# Patient Record
Sex: Male | Born: 1965
Health system: Southern US, Community
[De-identification: ages and names within clinical notes are randomized; demographics above are authoritative.]

## PROBLEM LIST (undated history)

## (undated) DIAGNOSIS — M1990 Unspecified osteoarthritis, unspecified site: Secondary | ICD-10-CM

## (undated) DIAGNOSIS — K219 Gastro-esophageal reflux disease without esophagitis: Secondary | ICD-10-CM

## (undated) DIAGNOSIS — K589 Irritable bowel syndrome without diarrhea: Secondary | ICD-10-CM

## (undated) DIAGNOSIS — I499 Cardiac arrhythmia, unspecified: Secondary | ICD-10-CM

## (undated) DIAGNOSIS — E785 Hyperlipidemia, unspecified: Secondary | ICD-10-CM

## (undated) DIAGNOSIS — F419 Anxiety disorder, unspecified: Secondary | ICD-10-CM

## (undated) DIAGNOSIS — I4891 Unspecified atrial fibrillation: Secondary | ICD-10-CM

## (undated) DIAGNOSIS — B009 Herpesviral infection, unspecified: Secondary | ICD-10-CM

## (undated) HISTORY — PX: HERNIA REPAIR: SHX51

## (undated) HISTORY — DX: Herpesviral infection, unspecified: B00.9

## (undated) HISTORY — PX: OTHER SURGICAL HISTORY: SHX169

## (undated) HISTORY — DX: Unspecified osteoarthritis, unspecified site: M19.90

## (undated) HISTORY — DX: Anxiety disorder, unspecified: F41.9

## (undated) HISTORY — PX: ENDOSCOPIC PLANTAR FASCIOTOMY: SUR443

## (undated) HISTORY — DX: Hyperlipidemia, unspecified: E78.5

## (undated) HISTORY — DX: Cardiac arrhythmia, unspecified: I49.9

## (undated) HISTORY — DX: Gastro-esophageal reflux disease without esophagitis: K21.9

## (undated) HISTORY — PX: WISDOM TOOTH EXTRACTION: SHX21

## (undated) HISTORY — DX: Irritable bowel syndrome, unspecified: K58.9

## (undated) HISTORY — PX: POLYPECTOMY: SHX149

## (undated) HISTORY — PX: UPPER GASTROINTESTINAL ENDOSCOPY: SHX188

---

## 2013-03-02 ENCOUNTER — Encounter (INDEPENDENT_AMBULATORY_CARE_PROVIDER_SITE_OTHER): Payer: Self-pay

## 2013-03-02 ENCOUNTER — Ambulatory Visit (INDEPENDENT_AMBULATORY_CARE_PROVIDER_SITE_OTHER): Payer: BC Managed Care – PPO

## 2013-03-02 VITALS — BP 112/68 | HR 68 | Resp 16 | Ht 74.0 in | Wt 190.0 lb

## 2013-03-02 DIAGNOSIS — Z09 Encounter for follow-up examination after completed treatment for conditions other than malignant neoplasm: Secondary | ICD-10-CM

## 2013-03-02 DIAGNOSIS — M722 Plantar fascial fibromatosis: Secondary | ICD-10-CM

## 2013-03-02 NOTE — Progress Notes (Signed)
  Subjective:    Patient ID: Joshua Chavez, male    DOB: 1966-04-05, 47 y.o.   MRN: [id number] "It's getting better by the day.  I had to go up a size on my shoes."  Post op EPF HPI    Review of Systems not done     Objective:   Physical Exam  Neurovascular status is intact epicritic and proprioceptive sensations intact. Patient continues to have some slight inferior heel pain left heel incision is well coapted suggested continued warm compress ice packs alternating every evening. Patient has been avoiding NSAIDs however we'll occasionally use Mobic as needed. Generally improving and has been able to return to work activities. Also, the shoe size on his left foot which seems to be helping with his walking activities.      Assessment & Plan:  Plantar fasciitis/heel spur syndrome left foot improving postoperatively approximately 2 and half months postop EPF procedure. Plan for 2-3 months for long-term followup maintain ice and NSAID therapies as needed maintain orthotics and a coming shoe gear as instructed.  Alvan Dame DPM

## 2013-03-02 NOTE — Patient Instructions (Signed)
Alternate daily applications of heat and ice 15 minutes each heat and the ice to the inferior left heel  ICE INSTRUCTIONS  Apply ice or cold pack to the affected area at least 3 times a day for 10-15 minutes each time.  You should also use ice after prolonged activity or vigorous exercise.  Do not apply ice longer than 20 minutes at one time.  Always keep a cloth between your skin and the ice pack to prevent burns.  Being consistent and following these instructions will help control your symptoms.  We suggest you purchase a gel ice pack because they are reusable and do bit leak.  Some of them are designed to wrap around the area.  Use the method that works best for you.  Here are some other suggestions for icing.   Use a frozen bag of peas or corn-inexpensive and molds well to your body, usually stays frozen for 10 to 20 minutes.  Wet a towel with cold water and squeeze out the excess until it's damp.  Place in a bag in the freezer for 20 minutes. Then remove and use.

## 2013-06-07 ENCOUNTER — Ambulatory Visit (INDEPENDENT_AMBULATORY_CARE_PROVIDER_SITE_OTHER): Payer: BC Managed Care – PPO

## 2013-06-07 VITALS — BP 118/77 | HR 68 | Resp 18

## 2013-06-07 DIAGNOSIS — M2040 Other hammer toe(s) (acquired), unspecified foot: Secondary | ICD-10-CM

## 2013-06-07 DIAGNOSIS — G5762 Lesion of plantar nerve, left lower limb: Secondary | ICD-10-CM

## 2013-06-07 DIAGNOSIS — G5760 Lesion of plantar nerve, unspecified lower limb: Secondary | ICD-10-CM

## 2013-06-07 DIAGNOSIS — M722 Plantar fascial fibromatosis: Secondary | ICD-10-CM

## 2013-06-07 DIAGNOSIS — M779 Enthesopathy, unspecified: Secondary | ICD-10-CM

## 2013-06-07 DIAGNOSIS — Z09 Encounter for follow-up examination after completed treatment for conditions other than malignant neoplasm: Secondary | ICD-10-CM

## 2013-06-07 NOTE — Progress Notes (Signed)
   Subjective:    Patient ID: Ivor MessierDavid Perin, male    DOB: 08-10-1965, 48 y.o.   MRN: [id number]  HPI I am doing ok on my left and it has been 5 months since my surgery in august of last year and it does burn and throb and it does get numb and tingle on the side of my right foot    Review of Systems no other changes on reviewed having some slight breathing problems otherwise unremarkable    Objective:   Physical Exam Okay objective findings reveal intact in palpable pedal pulses DP and PT +2/4 bilateral Refill time 3 seconds all digits epicritic and proprioceptive sensations intact and symmetric there is still some slight tenderness not in the inferior calcaneal tubercle although more and mid arch especially with strenuous and prolonged activities. The heel is generally improving significantly however patient is having what he describes as numbness and tingling and stinging sensations up into his toes involving somewhat the hallux avulsing second third toes on evaluation this time patient has semirigid digital contractures more so on left and right foot with hammertoe deformities 234 and 5 have some weakness in the extensor tendons with the long flexors overpowering the toes causing a contracture. Patient's may be having some neuroma symptomology second interspace due to digital contractures and capsulitis and mild arthropathy the MTP joints pain on direct lateral compression is consistent with possible early neuroma       Assessment & Plan:  Plan at this time patient recently resumed utilizing meloxicam and will continue to do so also use ice and we'll obtain some wider shoes. Metatarsal pads applied to the orthotics and dispensed extra-depth has for use in other orthotics that patient uses in his work boots and shoes. Reappointed with the next month if fails to improve may be candidate for either a steroid Dosepak or steroid injection second interspace. Again stressed ice to the area meloxicam and  wider shoes which are neuromas dispensed to the patient.  Alvan Dameichard Trellis Vanoverbeke DPM

## 2013-06-07 NOTE — Patient Instructions (Signed)
Morton's Neuroma Neuralgia (nerve pain) or neuroma (benign [non-cancerous] nerve tumor) may develop on any interdigital nerve. The interdigital nerves (nerves between digits) of the foot travel beneath and between the metatarsals (long bones of the fore foot) and pass the nerve endings to the toes. The third interdigital is a common place for a small neuroma to form called Morton's neuroma. Another nerve to be affected commonly is the fourth interdigital nerve. This would be in approximately in the area of the base or ball under the bottom of your fourth toe. This condition occurs more commonly in women and is usually on one side. It is usually first noticed by pain radiating (spreading) to the ball of the foot or to the toes. CAUSES The cause of interdigital neuralgia may be from low grade repetitive trauma (damage caused by an accident) as in activities causing a repeated pounding of the foot (running, jumping etc.). It is also caused by improper footwear or recent loss of the fatty padding on the bottom of the foot. TREATMENT  The condition often resolves (goes away) simply with decreasing activity if that is thought to be the cause. Proper shoes are beneficial. Orthotics (special foot support aids) such as a metatarsal bar are often beneficial. This condition usually responds to conservative therapy, however if surgery is necessary it usually brings complete relief. HOME CARE INSTRUCTIONS   Apply ice to the area of soreness for 15-20 minutes, 03-04 times per day, while awake for the first 2 days. Put ice in a plastic bag and place a towel between the bag of ice and your skin.  Only take over-the-counter or prescription medicines for pain, discomfort, or fever as directed by your caregiver. MAKE SURE YOU:   Understand these instructions.  Will watch your condition.  Will get help right away if you are not doing well or get worse. Document Released: 08/03/2000 Document Revised: 07/20/2011  Document Reviewed: 04/27/2005 [employer] Patient Information 2014 [employer].  

## 2016-04-08 HISTORY — PX: ESOPHAGOGASTRODUODENOSCOPY: SHX1529

## 2016-04-08 HISTORY — PX: COLONOSCOPY: SHX174

## 2016-09-04 ENCOUNTER — Ambulatory Visit (INDEPENDENT_AMBULATORY_CARE_PROVIDER_SITE_OTHER): Payer: BLUE CROSS/BLUE SHIELD

## 2016-09-04 ENCOUNTER — Encounter: Payer: Self-pay | Admitting: Podiatry

## 2016-09-04 ENCOUNTER — Ambulatory Visit (INDEPENDENT_AMBULATORY_CARE_PROVIDER_SITE_OTHER): Payer: BLUE CROSS/BLUE SHIELD | Admitting: Podiatry

## 2016-09-04 DIAGNOSIS — M216X9 Other acquired deformities of unspecified foot: Secondary | ICD-10-CM

## 2016-09-04 DIAGNOSIS — M2042 Other hammer toe(s) (acquired), left foot: Secondary | ICD-10-CM

## 2016-09-04 DIAGNOSIS — M722 Plantar fascial fibromatosis: Secondary | ICD-10-CM

## 2016-09-04 NOTE — Progress Notes (Signed)
   Subjective:    Patient ID: Joshua Chavez, male    DOB: 1965-05-14, 51 y.o.   MRN: [id number]  HPI 51 year old male presents the also concerns of hammertoes to his feet. He has multiple pairs of orthotics he is inquiring about possibly getting some of them refurbished. He denies any specific pain to his foot except for on the hammertoes as they rub against issues. He also gets pain we points to submetatarsal 5. This is mostly with weightbearing or pressure. Denies any recent injury or trauma. No swelling or redness.   Review of Systems  All other systems reviewed and are negative.      Objective:   Physical Exam General: AAO x3, NAD  Dermatological: Skin is warm, dry and supple bilateral. Nails x 10 are well manicured; remaining integument appears unremarkable at this time. There are no open sores, no preulcerative lesions, no rash or signs of infection present.  Vascular: Dorsalis Pedis artery and Posterior Tibial artery pedal pulses are 2/4 bilateral with immedate capillary fill time. Pedal hair growth present. No varicosities and no lower extremity edema present bilateral. There is no pain with calf compression, swelling, warmth, erythema.   Neruologic: Grossly intact via light touch bilateral. Vibratory intact via tuning fork bilateral. Protective threshold with Semmes Wienstein monofilament intact to all pedal sites bilateral.   Musculoskeletal: Cavus foot type is present laterally. Hammertoe contractures are present. There is prominent metatarsal heads plantarly with atrophy of the fat pad. Minimal tenderness at metatarsal 5 left foot there is no overlying edema, erythema, increase in warmth there is no pain vibratory sensation. MMT/5. Range of motion intact.  Assessment: Hammertoe deformity laterally due to cavus foot, submetatarsal 5 left foot pain  Plan: -Treatment options discussed including all alternatives, risks, and complications -Etiology of symptoms were  discussed -X-rays were obtained and reviewed with the patient. Cavus foot type present. No evidence of acute fracture identified. -Dispensed to crest to help take pressure off the toes. Discussed with him the future surgical intervention however he wishes about any surgery at this time. -Ice include one set of orthotics which are pointing apart. Ice and the other pair back to [employer].  -Follow-up once inserts arrive or sooner if needed. Call any questions or concerns.  Ovid Curd, DPM

## 2016-11-05 ENCOUNTER — Telehealth: Payer: Self-pay | Admitting: *Deleted

## 2016-11-05 DIAGNOSIS — M722 Plantar fascial fibromatosis: Secondary | ICD-10-CM

## 2016-11-05 NOTE — Telephone Encounter (Signed)
Patient's wife was in for a visit with Dr Ardelle AntonWagoner and was asking about her husband's inserts and I stated that I would check to see if they were here and they were so the patient paid for them. Misty StanleyLisa

## 2016-11-07 ENCOUNTER — Encounter (HOSPITAL_BASED_OUTPATIENT_CLINIC_OR_DEPARTMENT_OTHER): Payer: Self-pay | Admitting: *Deleted

## 2016-11-07 ENCOUNTER — Emergency Department (HOSPITAL_BASED_OUTPATIENT_CLINIC_OR_DEPARTMENT_OTHER)
Admission: EM | Admit: 2016-11-07 | Discharge: 2016-11-07 | Disposition: A | Discharge status: Home / Self Care | Payer: BLUE CROSS/BLUE SHIELD | Attending: Emergency Medicine | Admitting: Emergency Medicine

## 2016-11-07 ENCOUNTER — Emergency Department (HOSPITAL_BASED_OUTPATIENT_CLINIC_OR_DEPARTMENT_OTHER): Payer: BLUE CROSS/BLUE SHIELD

## 2016-11-07 DIAGNOSIS — I480 Paroxysmal atrial fibrillation: Secondary | ICD-10-CM

## 2016-11-07 DIAGNOSIS — Z87891 Personal history of nicotine dependence: Secondary | ICD-10-CM | POA: Insufficient documentation

## 2016-11-07 DIAGNOSIS — R002 Palpitations: Secondary | ICD-10-CM | POA: Diagnosis present

## 2016-11-07 LAB — CBC WITH DIFFERENTIAL/PLATELET
Basophils Absolute: 0 10*3/uL (ref 0.0–0.1)
Basophils Relative: 0 %
Eosinophils Absolute: 0.1 10*3/uL (ref 0.0–0.7)
Eosinophils Relative: 1 %
HCT: 44.3 % (ref 39.0–52.0)
Hemoglobin: 16.1 g/dL (ref 13.0–17.0)
Lymphocytes Relative: 20 %
Lymphs Abs: 1.4 10*3/uL (ref 0.7–4.0)
MCH: 33.3 pg (ref 26.0–34.0)
MCHC: 36.3 g/dL — ABNORMAL HIGH (ref 30.0–36.0)
MCV: 91.5 fL (ref 78.0–100.0)
Monocytes Absolute: 0.7 10*3/uL (ref 0.1–1.0)
Monocytes Relative: 10 %
Neutro Abs: 4.9 10*3/uL (ref 1.7–7.7)
Neutrophils Relative %: 69 %
Platelets: 179 10*3/uL (ref 150–400)
RBC: 4.84 MIL/uL (ref 4.22–5.81)
RDW: 12.5 % (ref 11.5–15.5)
WBC: 7.1 10*3/uL (ref 4.0–10.5)

## 2016-11-07 LAB — TROPONIN I: Troponin I: <0.03 ng/mL (ref <0.03)

## 2016-11-07 LAB — COMPREHENSIVE METABOLIC PANEL
ALT: 28 U/L (ref 17–63)
AST: 25 U/L (ref 15–41)
Albumin: 4.8 g/dL (ref 3.5–5.0)
Alkaline Phosphatase: 42 U/L (ref 38–126)
Anion gap: 12 (ref 5–15)
BUN: 17 mg/dL (ref 6–20)
CO2: 22 mmol/L (ref 22–32)
Calcium: 9.6 mg/dL (ref 8.9–10.3)
Chloride: 105 mmol/L (ref 101–111)
Creatinine, Ser: 0.85 mg/dL (ref 0.61–1.24)
GFR calc Af Amer: >60 mL/min (ref >60)
GFR calc non Af Amer: >60 mL/min (ref >60)
Glucose, Bld: 113 mg/dL — ABNORMAL HIGH (ref 65–99)
Potassium: 3.7 mmol/L (ref 3.5–5.1)
Sodium: 139 mmol/L (ref 135–145)
Total Bilirubin: 1.2 mg/dL (ref 0.3–1.2)
Total Protein: 7.7 g/dL (ref 6.5–8.1)

## 2016-11-07 LAB — MAGNESIUM: Magnesium: 2.2 mg/dL (ref 1.7–2.4)

## 2016-11-07 MED ORDER — METOPROLOL SUCCINATE 12.5 MG HALF TABLET
12.5000 mg | ORAL_TABLET | Freq: Every day | ORAL | Status: DC
  Filled 2016-11-07: qty 1

## 2016-11-07 MED ORDER — RIVAROXABAN 20 MG PO TABS: 20.0000 mg | ORAL_TABLET | Freq: Every day | ORAL | 0 refills | Status: DC

## 2016-11-07 MED ORDER — METOPROLOL TARTRATE 5 MG/5ML IV SOLN
2.5000 mg | Freq: Once | INTRAVENOUS | Status: AC
  Administered 2016-11-07: 2.5 mg via INTRAVENOUS
  Filled 2016-11-07: qty 5

## 2016-11-07 MED ORDER — METOPROLOL TARTRATE 5 MG/5ML IV SOLN
5.0000 mg | Freq: Once | INTRAVENOUS | Status: AC
  Administered 2016-11-07: 5 mg via INTRAVENOUS
  Filled 2016-11-07: qty 5

## 2016-11-07 MED ORDER — METOPROLOL SUCCINATE ER 25 MG PO TB24
12.5000 mg | ORAL_TABLET | Freq: Every day | ORAL | 0 refills | Status: DC
Start: 2016-11-07 — End: 2016-11-12

## 2016-11-07 NOTE — ED Notes (Signed)
Pt remains on cardiac monitor and auto VS q15

## 2016-11-07 NOTE — ED Notes (Signed)
Pt given ice water to drink per MD approval.

## 2016-11-07 NOTE — ED Triage Notes (Signed)
Pt reports heart palpitations since waking around 0430 this am. Reports mild sob. Denies n/v. Reports hx of same but no evaluation for it. Denies pain.

## 2016-11-07 NOTE — Progress Notes (Signed)
Called about Mr. Joshua Chavez, regarding newly identified a-fib. According to Dr. Madilyn Hookees, he drinks twice a week and was out drinking last night - felt his heart racing and presented today in a-fib with RVR. No history of HTN, stroke, diabetes or CHF. CHADSVASC score of 0. Unfortunately, he has had episodes per her report like this several weeks ago. Not clear if he is persistent or paroxysmal at this point. I would advise the following:  1) Start anticoagulation - Eliquis 5 mg BID or Xarelto 20 mg daily 2) Start toprol XL 12.5 mg daily  3) Avoid alcohol  We will contact the patient next week for a follow-up appointment and arrange for a monitor to determine if his a-fib is persistent or paroxysmal and if cardioversion will be necessary.  Thanks.  Chrystie NoseKenneth C. Hilty, MD, [facility]FACC  [place]  [facility]  Attending Cardiologist  Direct Dial: [phone]  Fax: [phone]  Website:  [email or web address]

## 2016-11-07 NOTE — ED Notes (Signed)
Pt given urinal in bed.

## 2016-11-07 NOTE — ED Notes (Signed)
Pt remains on cardiac monitor and auto VS 

## 2016-11-07 NOTE — ED Provider Notes (Signed)
[facility] Note   CSN: [id number] Arrival date & time: 11/07/16  1457  By signing my name below, I, Thelma Bargeick Cochran, attest that this documentation has been prepared under the direction and in the presence of No att. providers found. Electronically Signed: Thelma BargeNick Cochran, Scribe. 11/07/16. 3:19 PM.  History   Chief Complaint Chief Complaint  Patient presents with  . Atrial Fibrillation  The history is provided by the patient. No language interpreter was used.    HPI Comments: Joshua Chavez is a 51 y.o. male who presents to the Emergency Department complaining of constant atrial fibrillation since 4:30am this morning. He notes his heart is beating "like crazy" but denies any pain. He has had this happen before with his last episode a few months ago. He has been drinking water all day and ate regular meals today. Pt notes he is an occasional drinker, 2-3 times a week, and drank 4 beers and 1 shot of tequila last night, but this is not an unusual amount for him. He denies fever, cough, abdominal pain, leg swelling, and vomiting. He further denies smoking and use of street drugs. Pt has no other medical problems but notes a FMHx of his grandfather having colon cancer.Symptoms are moderate in nature.  Past Medical History:  Diagnosis Date  . Arthritis   . GERD (gastroesophageal reflux disease)   . Hyperlipidemia     There are no active problems to display for this patient.   Past Surgical History:  Procedure Laterality Date  . ENDOSCOPIC PLANTAR FASCIOTOMY Left   . HERNIA REPAIR    . shouler Left    shoulder  . WISDOM TOOTH EXTRACTION Bilateral        Home Medications    Prior to Admission medications   Medication Sig Start Date End Date Taking? Authorizing Provider  ANDROGEL PUMP 20.25 MG/ACT (1.62%) GEL  09/03/16  Yes [provider]  omeprazole (PRILOSEC) 20 MG capsule  09/03/16  Yes [provider]  valACYclovir (VALTREX) 1000 MG tablet   03/22/13  Yes [provider]  metoprolol succinate (TOPROL-XL) 25 MG 24 hr tablet Take 0.5 tablets (12.5 mg total) by mouth daily. 11/07/16   Tilden Fossaees, Acelynn Dejonge, MD  rivaroxaban (XARELTO) 20 MG TABS tablet Take 1 tablet (20 mg total) by mouth daily with supper. 11/07/16   Tilden Fossaees, Skila Rollins, MD    Family History No family history on file.  Social History Social History  Substance Use Topics  . Smoking status: Former Games developermoker  . Smokeless tobacco: Never Used  . Alcohol use Yes     Comment: occ     Allergies   Patient has no known allergies.   Review of Systems Review of Systems  Constitutional: Negative for fever.  Respiratory: Negative for cough.   Cardiovascular: Positive for palpitations. Negative for leg swelling.  Gastrointestinal: Negative for abdominal pain and vomiting.  All other systems reviewed and are negative.    Physical Exam Updated Vital Signs BP 110/87   Pulse 75   Temp 98.7 F (37.1 C) (Oral)   Resp 18   Ht 6\' 2"  (1.88 m)   Wt 79.4 kg (175 lb)   SpO2 98%   BMI 22.47 kg/m   Physical Exam  Constitutional: He is oriented to person, place, and time. He appears well-developed and well-nourished.  HENT:  Head: Normocephalic and atraumatic.  Cardiovascular:  No murmur heard. Tachycardic and irregularly irregular rhythm  Pulmonary/Chest: Effort normal and breath sounds normal. No respiratory distress.  Abdominal:  Soft. There is no tenderness. There is no rebound and no guarding.  Musculoskeletal: He exhibits no edema or tenderness.  Neurological: He is alert and oriented to person, place, and time.  Skin: Skin is warm and dry.  Psychiatric: He has a normal mood and affect. His behavior is normal.  Nursing note and vitals reviewed.    ED Treatments / Results  DIAGNOSTIC STUDIES: Oxygen Saturation is 100% on RA, normal by my interpretation.    COORDINATION OF CARE: 3:13 PM Discussed treatment plan with pt at bedside and pt agreed to  plan. @4 :15 pt is still in Afib, but he is feeling better. HR between 90-100 bpm.   Labs (all labs ordered are listed, but only abnormal results are displayed) Labs Reviewed  COMPREHENSIVE METABOLIC PANEL - Abnormal; Notable for the following:       Result Value   Glucose, Bld 113 (*)    All other components within normal limits  CBC WITH DIFFERENTIAL/PLATELET - Abnormal; Notable for the following:    MCHC 36.3 (*)    All other components within normal limits  TROPONIN I  MAGNESIUM    EKG  EKG Interpretation  Date/Time:  Saturday November 07 2016 15:02:49 EDT Ventricular Rate:  166 PR Interval:    QRS Duration: 80 QT Interval:  266 QTC Calculation: 442 R Axis:   60 Text Interpretation:  Atrial fibrillation with rapid ventricular response Nonspecific ST abnormality Abnormal ECG Confirmed by Lincoln Brigham [phone]) on 11/07/2016 5:19:31 PM       Radiology Dg Chest Port 1 View  Result Date: 11/07/2016 CLINICAL DATA:  Shortness of breath and atrial fibrillation. EXAM: PORTABLE CHEST 1 VIEW COMPARISON:  None. FINDINGS: Cardiomediastinal silhouette is normal. Mediastinal contours appear intact. There is no evidence of focal airspace consolidation, pleural effusion or pneumothorax. Mild right apical subpleural scarring. Osseous structures are without acute abnormality. Soft tissues are grossly normal. IMPRESSION: No active disease. Electronically Signed   By: Ted Mcalpine M.D.   On: 11/07/2016 15:46    Procedures Procedures (including critical care time) CRITICAL CARE Performed by: Tilden Fossa   Total critical care time: 35 minutes  Critical care time was exclusive of separately billable procedures and treating other patients.  Critical care was necessary to treat or prevent imminent or life-threatening deterioration.  Critical care was time spent personally by me on the following activities: development of treatment plan with patient and/or surrogate as well as nursing,  discussions with consultants, evaluation of patient's response to treatment, examination of patient, obtaining history from patient or surrogate, ordering and performing treatments and interventions, ordering and review of laboratory studies, ordering and review of radiographic studies, pulse oximetry and re-evaluation of patient's condition.  Medications Ordered in ED Medications  metoprolol tartrate (LOPRESSOR) injection 5 mg (5 mg Intravenous Given 11/07/16 1520)  metoprolol tartrate (LOPRESSOR) injection 2.5 mg (2.5 mg Intravenous Given 11/07/16 1708)     Initial Impression / Assessment and Plan / ED Course  I have reviewed the triage vital signs and the nursing notes.  Pertinent labs & imaging results that were available during my care of the patient were reviewed by me and considered in my medical decision making (see chart for details).    chadsvasc 0  Patient here for evaluation of palpitations. He is in new onset A. fib with RVR. His rate is controlled after metoprolol. He is feeling significantly improved following rate control. Discussed with Dr. Rennis Golden, cardiologist on call. Will provide prescription for Toprol and initiate anticoagulation .  Discussed with patient risks of anticoagulation as well as atrial fibrillation with bleeding precautions.  Close outpatient follow up and return precautions.    Final Clinical Impressions(s) / ED Diagnoses   Final diagnoses:  Paroxysmal atrial fibrillation [facility])    New Prescriptions Discharge Medication List as of 11/07/2016  6:12 PM    START taking these medications   Details  metoprolol succinate (TOPROL-XL) 25 MG 24 hr tablet Take 0.5 tablets (12.5 mg total) by mouth daily., Starting Sat 11/07/2016, Print    rivaroxaban (XARELTO) 20 MG TABS tablet Take 1 tablet (20 mg total) by mouth daily with supper., Starting Sat 11/07/2016, Print       I personally performed the services described in this documentation, which was scribed in my  presence. The recorded information has been reviewed and is accurate.    Tilden Fossa, MD 11/07/16 [phone]

## 2016-11-11 ENCOUNTER — Encounter: Payer: Self-pay | Admitting: Cardiology

## 2016-11-11 DIAGNOSIS — I480 Paroxysmal atrial fibrillation: Secondary | ICD-10-CM

## 2016-11-11 DIAGNOSIS — Z7901 Long term (current) use of anticoagulants: Secondary | ICD-10-CM | POA: Insufficient documentation

## 2016-11-11 HISTORY — DX: Paroxysmal atrial fibrillation: I48.0

## 2016-11-12 ENCOUNTER — Encounter: Payer: Self-pay | Admitting: Cardiology

## 2016-11-12 ENCOUNTER — Ambulatory Visit (INDEPENDENT_AMBULATORY_CARE_PROVIDER_SITE_OTHER): Payer: BLUE CROSS/BLUE SHIELD | Admitting: Cardiology

## 2016-11-12 VITALS — BP 114/68 | HR 73 | Ht 74.0 in | Wt 180.4 lb

## 2016-11-12 DIAGNOSIS — I480 Paroxysmal atrial fibrillation: Secondary | ICD-10-CM

## 2016-11-12 DIAGNOSIS — Z7901 Long term (current) use of anticoagulants: Secondary | ICD-10-CM

## 2016-11-12 MED ORDER — METOPROLOL SUCCINATE ER 25 MG PO TB24: 12.5000 mg | ORAL_TABLET | Freq: Every day | ORAL | 6 refills | Status: DC

## 2016-11-12 MED ORDER — ASPIRIN EC 81 MG PO TBEC: 81.0000 mg | DELAYED_RELEASE_TABLET | Freq: Every day | ORAL | 3 refills | Status: DC

## 2016-11-12 NOTE — Patient Instructions (Addendum)
Medication Instructions:  Your physician has recommended you make the following change in your medication: STOP xarelto START asprin 81 mg daily. This can be purchased over the counter   Labwork: None  Testing/Procedures: Your physician has requested that you have an echocardiogram. Echocardiography is a painless test that uses sound waves to create images of your heart. It provides your doctor with information about the size and shape of your heart and how well your heart's chambers and valves are working. This procedure takes approximately one hour. There are no restrictions for this procedure.  This will be done at [facility].  Follow-Up: Your physician wants you to follow-up in: 6 months. You will receive a reminder letter in the mail two months in advance. If you don't receive a letter, please call our office to schedule the follow-up appointment.   Any Other Special Instructions Will Be Listed Below (If Applicable).     If you need a refill on your cardiac medications before your next appointment, please call your pharmacy.    Consider purchasing Kardia from [facility]  Atrial Fibrillation Atrial fibrillation is a type of irregular or rapid heartbeat (arrhythmia). In atrial fibrillation, the heart quivers continuously in a chaotic pattern. This occurs when parts of the heart receive disorganized signals that make the heart unable to pump blood normally. This can increase the risk for stroke, heart failure, and other heart-related conditions. There are different types of atrial fibrillation, including:  Paroxysmal atrial fibrillation. This type starts suddenly, and it usually stops on its own shortly after it starts.  Persistent atrial fibrillation. This type often lasts longer than a week. It may stop on its own or with treatment.  Long-lasting persistent atrial fibrillation. This type lasts longer than 12 months.  Permanent atrial fibrillation. This type does not go  away.  Talk with your health care provider to learn about the type of atrial fibrillation that you have. What are the causes? This condition is caused by some heart-related conditions or procedures, including:  A heart attack.  Coronary artery disease.  Heart failure.  Heart valve conditions.  High blood pressure.  Inflammation of the sac that surrounds the heart (pericarditis).  Heart surgery.  Certain heart rhythm disorders, such as Wolf-Parkinson-White syndrome.  Other causes include:  Pneumonia.  Obstructive sleep apnea.  Blockage of an artery in the lungs (pulmonary embolism, or PE).  Lung cancer.  Chronic lung disease.  Thyroid problems, especially if the thyroid is overactive (hyperthyroidism).  Caffeine.  Excessive alcohol use or illegal drug use.  Use of some medicines, including certain decongestants and diet pills.  Sometimes, the cause cannot be found. What increases the risk? This condition is more likely to develop in:  People who are older in age.  People who smoke.  People who have diabetes mellitus.  People who are overweight (obese).  Athletes who exercise vigorously.  What are the signs or symptoms? Symptoms of this condition include:  A feeling that your heart is beating rapidly or irregularly.  A feeling of discomfort or pain in your chest.  Shortness of breath.  Sudden light-headedness or weakness.  Getting tired easily during exercise.  In some cases, there are no symptoms. How is this diagnosed? Your health care provider may be able to detect atrial fibrillation when taking your pulse. If detected, this condition may be diagnosed with:  An electrocardiogram (ECG).  A Holter monitor test that records your heartbeat patterns over a 24-hour period.  Transthoracic echocardiogram (TTE) to evaluate  how blood flows through your heart.  Transesophageal echocardiogram (TEE) to view more detailed images of your heart.  A  stress test.  Imaging tests, such as a CT scan or chest X-ray.  Blood tests.  How is this treated? The main goals of treatment are to prevent blood clots from forming and to keep your heart beating at a normal rate and rhythm. The type of treatment that you receive depends on many factors, such as your underlying medical conditions and how you feel when you are experiencing atrial fibrillation. This condition may be treated with:  Medicine to slow down the heart rate, bring the heart's rhythm back to normal, or prevent clots from forming.  Electrical cardioversion. This is a procedure that resets your heart's rhythm by delivering a controlled, low-energy shock to the heart through your skin.  Different types of ablation, such as catheter ablation, catheter ablation with pacemaker, or surgical ablation. These procedures destroy the heart tissues that send abnormal signals. When the pacemaker is used, it is placed under your skin to help your heart beat in a regular rhythm.  Follow these instructions at home:  Take over-the counter and prescription medicines only as told by your health care provider.  If your health care provider prescribed a blood-thinning medicine (anticoagulant), take it exactly as told. Taking too much blood-thinning medicine can cause bleeding. If you do not take enough blood-thinning medicine, you will not have the protection that you need against stroke and other problems.  Do not use tobacco products, including cigarettes, chewing tobacco, and e-cigarettes. If you need help quitting, ask your health care provider.  If you have obstructive sleep apnea, manage your condition as told by your health care provider.  Do not drink alcohol.  Do not drink beverages that contain caffeine, such as coffee, soda, and tea.  Maintain a healthy weight. Do not use diet pills unless your health care provider approves. Diet pills may make heart problems worse.  Follow diet  instructions as told by your health care provider.  Exercise regularly as told by your health care provider.  Keep all follow-up visits as told by your health care provider. This is important. How is this prevented?  Avoid drinking beverages that contain caffeine or alcohol.  Avoid certain medicines, especially medicines that are used for breathing problems.  Avoid certain herbs and herbal medicines, such as those that contain ephedra or ginseng.  Do not use illegal drugs, such as cocaine and amphetamines.  Do not smoke.  Manage your high blood pressure. Contact a health care provider if:  You notice a change in the rate, rhythm, or strength of your heartbeat.  You are taking an anticoagulant and you notice increased bruising.  You tire more easily when you exercise or exert yourself. Get help right away if:  You have chest pain, abdominal pain, sweating, or weakness.  You feel nauseous.  You notice blood in your vomit, bowel movement, or urine.  You have shortness of breath.  You suddenly have swollen feet and ankles.  You feel dizzy.  You have sudden weakness or numbness of the face, arm, or leg, especially on one side of the body.  You have trouble speaking, trouble understanding, or both (aphasia).  Your face or your eyelid droops on one side. These symptoms may represent a serious problem that is an emergency. Do not wait to see if the symptoms will go away. Get medical help right away. Call your local emergency services (911 in the  [place] Do not drive yourself to the hospital. This information is not intended to replace advice given to you by your health care provider. Make sure you discuss any questions you have with your health care provider. Document Released: 04/27/2005 Document Revised: 09/04/2015 Document Reviewed: 08/22/2014 Elsevier Interactive Patient Education  2017 [employer]

## 2016-11-12 NOTE — Progress Notes (Signed)
Cardiology Office Note:    Date:  11/12/2016   ID:  Joshua Chavez, DOB 1965-11-02, MRN [id number]  PCP:  Paulina Fusi, MD  Cardiologist:  Norman Herrlich, MD    Referring MD: Paulina Fusi, MD  ASSESSMENT:    1. Anticoagulated   2. PAF (paroxysmal atrial fibrillation) (HCC)    PLAN:    In order of problems listed above:  1. He has resumed sinus rhythm his risk of systemic emboli and stroke is very low with a chads 2 score of 0 and I'll have him discontinue his anticoagulant take aspirin coated 81 mg daily with food. 2. Improved now in sinus rhythm. He is hopeful that with lifestyle modification reducing stress alcohol caffeine that he will not have recurrence. I told him that I'm not sure this is true statement Aston remain on beta blocker to screen his pulse at home with father blood pressure, or a phone adapter,Kardia and continue his beta blocker until seen in the office in 6 months in follow-up.. I did ask him to have an echocardiogram performed to screen for cardiomyopathy and left atrial enlargement. If he is clinical recurrence of failure antiarrhythmic therapy however with young age he be a good candidate for interventional EP procedures  Next appointment   Medication Adjustments/Labs and Tests Ordered: Current medicines are reviewed at length with the patient today.  Concerns regarding medicines are outlined above.  Orders Placed This Encounter  Procedures  . EKG 12-Lead  . ECHOCARDIOGRAM COMPLETE   Meds ordered this encounter  Medications  . aspirin EC 81 MG tablet    Sig: Take 1 tablet (81 mg total) by mouth daily.    Dispense:  90 tablet    Refill:  3  . DISCONTD: metoprolol succinate (TOPROL-XL) 25 MG 24 hr tablet    Sig: Take 0.5 tablets (12.5 mg total) by mouth daily.    Dispense:  45 tablet    Refill:  6  . metoprolol succinate (TOPROL-XL) 25 MG 24 hr tablet    Sig: Take 0.5 tablets (12.5 mg total) by mouth daily.    Dispense:  45 tablet    Refill:   6     Chief Complaint  Patient presents with  . Hospitalization Follow-up    [facility] ED flup for atrial fibrillation    History of Present Illness:    Joshua Chavez is a 51 y.o. male who is being seen today for the evaluation of Atrial fibrillation and follow-up to recent [facility] ED visit. at the request of Paulina Fusi, MD. Symptomatically he converted to sinus rhythm 48 hours after that ED visit. He's had previous episodes particularly at nighttime of brief rapid heart rhythm lasting for minutes. He relates he is under intense family stress at the time and was also drinking alcohol heavily. He is reduced stress he is abstaining from alcohol and caffeine and has had no palpitation. He has no history of heart disease but has a background history of cigarette smoking and failed a hazmat evaluation several years ago. He now exercises one to 2 days per week is mildly short of breath at high levels of cardiovascular exercise not severe limiting and has no chest pain syncope TIA. He has no history of congenital or rheumatic heart disease. Labs reviewed from [facility] ED were normal thyroid was not done but his wife tells me he has routine labs done frequently with his PCP and has no history of hypothyroid thyroid disease or  it. I'll ask his family physician to research this.   Past Medical History:  Diagnosis Date  . Arrhythmia   . Arthritis   . GERD (gastroesophageal reflux disease)   . Hyperlipidemia     Past Surgical History:  Procedure Laterality Date  . ENDOSCOPIC PLANTAR FASCIOTOMY Left   . HERNIA REPAIR    . HERNIA REPAIR Bilateral   . shouler Left    shoulder  . WISDOM TOOTH EXTRACTION Bilateral     Current Medications: Current Meds  Medication Sig  . ANDROGEL PUMP 20.25 MG/ACT (1.62%) GEL   . B Complex-C (B-COMPLEX WITH VITAMIN C) tablet Take 1 tablet by mouth daily.  . Magnesium 250 MG TABS Take 250 mg by mouth daily.  . Melatonin 1 MG TABS  Take 1 mg by mouth at bedtime as needed.  . metoprolol succinate (TOPROL-XL) 25 MG 24 hr tablet Take 0.5 tablets (12.5 mg total) by mouth daily.  Marland Kitchen omeprazole (PRILOSEC) 20 MG capsule Take 20 mg by mouth daily.   . valACYclovir (VALTREX) 1000 MG tablet Take 1,000 mg by mouth daily as needed.   . [DISCONTINUED] metoprolol succinate (TOPROL-XL) 25 MG 24 hr tablet Take 0.5 tablets (12.5 mg total) by mouth daily.  . [DISCONTINUED] metoprolol succinate (TOPROL-XL) 25 MG 24 hr tablet Take 0.5 tablets (12.5 mg total) by mouth daily.  . [DISCONTINUED] rivaroxaban (XARELTO) 20 MG TABS tablet Take 1 tablet (20 mg total) by mouth daily with supper.     Allergies:   Patient has no known allergies.   Social History   Social History  . Marital status: Married    Spouse name: N/A  . Number of children: N/A  . Years of education: N/A   Social History Main Topics  . Smoking status: Former Games developer  . Smokeless tobacco: Never Used  . Alcohol use Yes     Comment: occ  . Drug use: No  . Sexual activity: Not Asked   Other Topics Concern  . None   Social History Narrative  . None     Family History: The patient's family history includes Colon cancer in his maternal grandfather; Hypertension in his mother.  ROS:   ROS Please see the history of present illness.     All other systems reviewed and are negative.  EKGs/Labs/Other Studies Reviewed:    The following studies were reviewed today: [facility] ED records including labs CBC CMP troponin normal and EKG with rapid uncontrolled atrial fibrillation  EKG:  EKG is  ordered today.  The ekg ordered today demonstrates sinus rhythm normal  Recent Labs: 11/07/2016: ALT 28; BUN 17; Creatinine, Ser 0.85; Hemoglobin 16.1; Magnesium 2.2; Platelets 179; Potassium 3.7; Sodium 139  Recent Lipid Panel No results found for: CHOL, TRIG, HDL, CHOLHDL, VLDL, LDLCALC, LDLDIRECT  Physical Exam:    VS:  BP 114/68   Pulse 73   Ht 6\' 2"  (1.88 m)    Wt 180 lb 6.4 oz (81.8 kg)   SpO2 97%   BMI 23.16 kg/m     Wt Readings from Last 3 Encounters:  11/12/16 180 lb 6.4 oz (81.8 kg)  11/07/16 175 lb (79.4 kg)  03/02/13 190 lb (86.2 kg)     GEN:  Well nourished, well developed in no acute distress HEENT: Normal NECK: No JVD; No carotid bruits LYMPHATICS: No lymphadenopathy CARDIAC: RRR, no murmurs, rubs, gallops RESPIRATORY:  Clear to auscultation without rales, wheezing or rhonchi  ABDOMEN: Soft, non-tender, non-distended MUSCULOSKELETAL:  No edema; No deformity  SKIN: Warm and dry NEUROLOGIC:  Alert and oriented x 3 PSYCHIATRIC:  Normal affect  Skin normal no pallor of the skin or the membranes up her extremity lower extremity pulses are full no stasis or ischemic changes.   Signed, Norman HerrlichBrian Lakedra Washington, MD  11/12/2016 10:25 AM    [facility]

## 2016-11-13 ENCOUNTER — Encounter (HOSPITAL_COMMUNITY): Payer: Self-pay | Admitting: Nurse Practitioner

## 2016-11-13 ENCOUNTER — Ambulatory Visit (HOSPITAL_COMMUNITY)
Admission: RE | Admit: 2016-11-13 | Discharge: 2016-11-13 | Disposition: A | Discharge status: Home / Self Care | Payer: BLUE CROSS/BLUE SHIELD | Source: Ambulatory Visit | Attending: Nurse Practitioner | Admitting: Nurse Practitioner

## 2016-11-13 VITALS — BP 118/70 | HR 65 | Ht 74.0 in | Wt 179.8 lb

## 2016-11-13 DIAGNOSIS — I4891 Unspecified atrial fibrillation: Secondary | ICD-10-CM | POA: Insufficient documentation

## 2016-11-13 DIAGNOSIS — M1990 Unspecified osteoarthritis, unspecified site: Secondary | ICD-10-CM | POA: Insufficient documentation

## 2016-11-13 DIAGNOSIS — Z8249 Family history of ischemic heart disease and other diseases of the circulatory system: Secondary | ICD-10-CM | POA: Diagnosis not present

## 2016-11-13 DIAGNOSIS — Z9889 Other specified postprocedural states: Secondary | ICD-10-CM | POA: Insufficient documentation

## 2016-11-13 DIAGNOSIS — E785 Hyperlipidemia, unspecified: Secondary | ICD-10-CM | POA: Diagnosis not present

## 2016-11-13 DIAGNOSIS — I480 Paroxysmal atrial fibrillation: Secondary | ICD-10-CM

## 2016-11-13 DIAGNOSIS — Z7982 Long term (current) use of aspirin: Secondary | ICD-10-CM | POA: Insufficient documentation

## 2016-11-13 DIAGNOSIS — K219 Gastro-esophageal reflux disease without esophagitis: Secondary | ICD-10-CM | POA: Diagnosis not present

## 2016-11-13 DIAGNOSIS — Z87891 Personal history of nicotine dependence: Secondary | ICD-10-CM | POA: Insufficient documentation

## 2016-11-13 DIAGNOSIS — Z800 Family history of malignant neoplasm of digestive organs: Secondary | ICD-10-CM | POA: Insufficient documentation

## 2016-11-13 MED ORDER — DILTIAZEM HCL 30 MG PO TABS: ORAL_TABLET | ORAL | 1 refills | Status: DC

## 2016-11-13 NOTE — Patient Instructions (Signed)
Your physician has recommended you make the following change in your medication:  1)Cardizem 30mg -- take 1 tablet every 4 hours AS NEEDED for AFIB heart rate over 100. 

## 2016-11-13 NOTE — Progress Notes (Signed)
Primary Care Physician: Paulina FusiSchultz, Douglas E, MD Referring Physician: Dr. Rennis GoldenHilty Cardiologist: Dr. Lysle RubensMunley  Joshua Chavez is a 51 y.o. male with a h/o new onset afib that was seen and treated in the ER for RVR  6/20. IV cardizem was used and did not convert pt. He had drunk alcohol the night before. He was sent home on xarelto 20 mg daily with chadsvasc score of 0 and metoprolol xl 25 mg 1/2 tab a day. He had f/u with Dr. Dulce [facility]ellarMunley yesterday [facility] and pt had returned to SR. Xarelto was stopped. He will continue on metoprolol for now. Echo has been ordered.  He does not smoke, he has cut out caffeine. He does not snore or have apnea per his wife. He is normal weight and exercises at the gym a couple of times a week. Felt tired in afib.  Today, he denies symptoms of palpitations, chest pain, shortness of breath, orthopnea, PND, lower extremity edema, dizziness, presyncope, syncope, or neurologic sequela. The patient is tolerating medications without difficulties and is otherwise without complaint today.   Past Medical History:  Diagnosis Date  . Arrhythmia   . Arthritis   . GERD (gastroesophageal reflux disease)   . Hyperlipidemia    Past Surgical History:  Procedure Laterality Date  . ENDOSCOPIC PLANTAR FASCIOTOMY Left   . HERNIA REPAIR    . HERNIA REPAIR Bilateral   . shouler Left    shoulder  . WISDOM TOOTH EXTRACTION Bilateral     Current Outpatient Prescriptions  Medication Sig Dispense Refill  . ANDROGEL PUMP 20.25 MG/ACT (1.62%) GEL     . aspirin EC 81 MG tablet Take 1 tablet (81 mg total) by mouth daily. 90 tablet 3  . B Complex-C (B-COMPLEX WITH VITAMIN C) tablet Take 1 tablet by mouth daily.    . Magnesium 250 MG TABS Take 250 mg by mouth daily.    . Melatonin 1 MG TABS Take 1 mg by mouth at bedtime as needed.    . metoprolol succinate (TOPROL-XL) 25 MG 24 hr tablet Take 0.5 tablets (12.5 mg total) by mouth daily. 45 tablet 6  . omeprazole (PRILOSEC) 20 MG capsule Take  20 mg by mouth daily.     . valACYclovir (VALTREX) 1000 MG tablet Take 1,000 mg by mouth daily as needed.     . diltiazem (CARDIZEM) 30 MG tablet Take 1 tablet every 4 hours AS NEEDED for Afib heart rate >100 45 tablet 1   No current facility-administered medications for this encounter.     No Known Allergies  Social History   Social History  . Marital status: Married    Spouse name: N/A  . Number of children: N/A  . Years of education: N/A   Occupational History  . Not on file.   Social History Main Topics  . Smoking status: Former Games developermoker  . Smokeless tobacco: Never Used  . Alcohol use Yes     Comment: occ  . Drug use: No  . Sexual activity: Not on file   Other Topics Concern  . Not on file   Social History Narrative  . No narrative on file    Family History  Problem Relation Age of Onset  . Hypertension Mother   . Colon cancer Maternal Grandfather     ROS- All systems are reviewed and negative except as per the HPI above  Physical Exam: Vitals:   11/13/16 1008  BP: 118/70  Pulse: 65  SpO2: 96%  Weight: 179  lb 12.8 oz (81.6 kg)  Height: 6\' 2"  (1.88 m)   Wt Readings from Last 3 Encounters:  11/13/16 179 lb 12.8 oz (81.6 kg)  11/12/16 180 lb 6.4 oz (81.8 kg)  11/07/16 175 lb (79.4 kg)    Labs: Lab Results  Component Value Date   NA 139 11/07/2016   K 3.7 11/07/2016   CL 105 11/07/2016   CO2 22 11/07/2016   GLUCOSE 113 (H) 11/07/2016   BUN 17 11/07/2016   CREATININE 0.85 11/07/2016   CALCIUM 9.6 11/07/2016   MG 2.2 11/07/2016   No results found for: INR No results found for: CHOL, HDL, LDLCALC, TRIG   GEN- The patient is well appearing, alert and oriented x 3 today.   Head- normocephalic, atraumatic Eyes-  Sclera clear, conjunctiva pink Ears- hearing intact Oropharynx- clear Neck- supple, no JVP Lymph- no cervical lymphadenopathy Lungs- Clear to ausculation bilaterally, normal work of breathing Heart- Regular rate and rhythm, no  murmurs, rubs or gallops, PMI not laterally displaced GI- soft, NT, ND, + BS Extremities- no clubbing, cyanosis, or edema MS- no significant deformity or atrophy Skin- no rash or lesion Psych- euthymic mood, full affect Neuro- strength and sensation are intact  EKG- not done today Epic records reviewed    Assessment and Plan: 1. New onset symptomatic afib with RVR General education re afib Has now returned to SR Off xarelto with a chadsvasc score of 0, keep on hand and restart if goes back into prolonged afib episode Continue metoprolol 12.5 mg daily for now, per plan with Dr. Dulce Sellar will likely stop in 6 months if afib stays quiet Will give cardizem 30 mg as pill in pocket in case afib with rvr returns Recommended to limit alcohol to no more than 2  drinks a week Limit caffeine Continue regular exercise and keep weight normal Echo pending  F/u with Dr. Dulce Sellar as scheduled in 6 months Afib clinic as needed  Lupita Leash C. [facility] [address] [place], [place] [address] [phone]

## 2016-11-18 ENCOUNTER — Encounter: Payer: Self-pay | Admitting: Cardiology

## 2016-11-18 DIAGNOSIS — I482 Chronic atrial fibrillation: Secondary | ICD-10-CM | POA: Diagnosis not present

## 2016-11-18 DIAGNOSIS — I361 Nonrheumatic tricuspid (valve) insufficiency: Secondary | ICD-10-CM | POA: Diagnosis not present

## 2016-11-19 ENCOUNTER — Telehealth: Payer: Self-pay

## 2016-11-19 NOTE — Telephone Encounter (Signed)
Patient [facility].

## 2016-11-26 ENCOUNTER — Telehealth: Payer: Self-pay | Admitting: Cardiology

## 2016-11-26 NOTE — Telephone Encounter (Signed)
Call metoprolol into [facility] (again)

## 2016-11-27 ENCOUNTER — Other Ambulatory Visit: Payer: Self-pay

## 2016-11-27 DIAGNOSIS — I480 Paroxysmal atrial fibrillation: Secondary | ICD-10-CM

## 2016-11-27 MED ORDER — METOPROLOL SUCCINATE ER 25 MG PO TB24: 12.5000 mg | ORAL_TABLET | Freq: Every day | ORAL | 6 refills | Status: DC

## 2016-11-27 NOTE — Telephone Encounter (Signed)
Refill resent to [facility]

## 2016-12-01 ENCOUNTER — Ambulatory Visit: Payer: BLUE CROSS/BLUE SHIELD | Admitting: Internal Medicine

## 2017-03-02 ENCOUNTER — Other Ambulatory Visit: Payer: Self-pay | Admitting: Cardiology

## 2017-05-20 ENCOUNTER — Other Ambulatory Visit: Payer: Self-pay

## 2017-05-20 ENCOUNTER — Ambulatory Visit: Payer: BLUE CROSS/BLUE SHIELD | Admitting: Cardiology

## 2017-05-20 VITALS — BP 118/68 | HR 73 | Ht 74.0 in | Wt 181.1 lb

## 2017-05-20 DIAGNOSIS — I480 Paroxysmal atrial fibrillation: Secondary | ICD-10-CM | POA: Diagnosis not present

## 2017-05-20 NOTE — Progress Notes (Signed)
Cardiology Office Note:    Date:  05/20/2017   ID:  Joshua Messieravid Zane, DOB Dec 14, 1965, MRN [id number]  PCP:  Paulina FusiSchultz, Douglas E, MD  Cardiologist:  Norman HerrlichBrian Munley, MD    Referring MD: Paulina FusiSchultz, Douglas E, MD    ASSESSMENT:    1. AF (paroxysmal atrial fibrillation) (HCC)    PLAN:    In order of problems listed above:  1. Stable no clinical recurrence he has a smart phone adapter to record EKG and has had no breakthrough episodes of abnormal heart rhythm. 2. He will continue his low-dose beta-blocker aspirin and avoid over-the-counter proarrhythmic's   Next appointment: One year   Medication Adjustments/Labs and Tests Ordered: Current medicines are reviewed at length with the patient today.  Concerns regarding medicines are outlined above.  Orders Placed This Encounter  Procedures  . EKG 12-Lead   No orders of the defined types were placed in this encounter.   No chief complaint on file.   History of Present Illness:    Joshua Chavez is a 52 y.o. male with a hx of PAF CHADS2vasc of 0  last seen 6 months ago. Compliance with diet, lifestyle and medications: yes Past Medical History:  Diagnosis Date  . Arrhythmia   . Arthritis   . GERD (gastroesophageal reflux disease)   . Hyperlipidemia     Past Surgical History:  Procedure Laterality Date  . ENDOSCOPIC PLANTAR FASCIOTOMY Left   . HERNIA REPAIR    . HERNIA REPAIR Bilateral   . shouler Left    shoulder  . WISDOM TOOTH EXTRACTION Bilateral     Current Medications: Current Meds  Medication Sig  . Acetylcysteine (NAC 600 PO) Take by mouth.  . ANDROGEL PUMP 20.25 MG/ACT (1.62%) GEL   . Ascorbic Acid (VITAMIN C) 1000 MG tablet Take 1,000 mg by mouth daily.  Marland Kitchen. aspirin EC 81 MG tablet Take 1 tablet (81 mg total) by mouth daily.  . B Complex-C (B-COMPLEX WITH VITAMIN C) tablet Take 1 tablet by mouth daily.  . Magnesium 250 MG TABS Take 250 mg by mouth daily.  . Melatonin 1 MG TABS Take 1 mg by mouth at bedtime as  needed.  . metoprolol succinate (TOPROL-XL) 25 MG 24 hr tablet Take 0.5 tablets (12.5 mg total) by mouth daily.  Marland Kitchen. omeprazole (PRILOSEC) 20 MG capsule Take 20 mg by mouth 2 (two) times daily before a meal.   . Pantothenic Acid 500 MG TABS Take 1 tablet by mouth daily.  . valACYclovir (VALTREX) 1000 MG tablet Take 1,000 mg by mouth daily as needed.      Allergies:   Patient has no known allergies.   Social History   Socioeconomic History  . Marital status: Married    Spouse name: Not on file  . Number of children: Not on file  . Years of education: Not on file  . Highest education level: Not on file  Social Needs  . Financial resource strain: Not on file  . Food insecurity - worry: Not on file  . Food insecurity - inability: Not on file  . Transportation needs - medical: Not on file  . Transportation needs - non-medical: Not on file  Occupational History  . Not on file  Tobacco Use  . Smoking status: Former Games developermoker  . Smokeless tobacco: Never Used  Substance and Sexual Activity  . Alcohol use: Yes    Comment: occ  . Drug use: No  . Sexual activity: Not on file  Other Topics Concern  .  Not on file  Social History Narrative  . Not on file     Family History: The patient's family history includes Colon cancer in his maternal grandfather; Hypertension in his mother. ROS:   Please see the history of present illness.    All other systems reviewed and are negative.  EKGs/Labs/Other Studies Reviewed:    The following studies were reviewed today:  EKG:  EKG ordered today.  The ekg ordered today demonstrates [facility] normal EKG EKG done 05/19/17 Sinus tachycardia, abnormal T waves Recent Labs: 11/07/2016: ALT 28; BUN 17; Creatinine, Ser 0.85; Hemoglobin 16.1; Magnesium 2.2; Platelets 179; Potassium 3.7; Sodium 139  Recent Lipid Panel No results found for: CHOL, TRIG, HDL, CHOLHDL, VLDL, LDLCALC, LDLDIRECT  Physical Exam:    VS:  BP 118/68 (BP Location: Right Arm, Patient  Position: Sitting, Cuff Size: Normal)   Pulse 73   Ht 6\' 2"  (1.88 m)   Wt 181 lb 1.3 oz (82.1 kg)   SpO2 96%   BMI 23.25 kg/m     Wt Readings from Last 3 Encounters:  05/20/17 181 lb 1.3 oz (82.1 kg)  11/13/16 179 lb 12.8 oz (81.6 kg)  11/12/16 180 lb 6.4 oz (81.8 kg)     GEN:  Well nourished, well developed in no acute distress HEENT: Normal NECK: No JVD; No carotid bruits LYMPHATICS: No lymphadenopathy CARDIAC: RRR, no murmurs, rubs, gallops RESPIRATORY:  Clear to auscultation without rales, wheezing or rhonchi  ABDOMEN: Soft, non-tender, non-distended MUSCULOSKELETAL:  No edema; No deformity  SKIN: Warm and dry NEUROLOGIC:  Alert and oriented x 3 PSYCHIATRIC:  Normal affect    Signed, Norman Herrlich, MD  05/20/2017 5:45 PM     [facility]

## 2017-05-20 NOTE — Patient Instructions (Addendum)
Medication Instructions:  Your physician recommends that you continue on your current medications as directed. Please refer to the Current Medication list given to you today.  Labwork: None  Testing/Procedures: You had an EKG today.  Follow-Up: Your physician wants you to follow-up in: 1 year. You will receive a reminder letter in the mail two months in advance. If you don't receive a letter, please call our office to schedule the follow-up appointment.  Any Other Special Instructions Will Be Listed Below (If Applicable).     If you need a refill on your cardiac medications before your next appointment, please call your pharmacy.    1. Avoid all over-the-counter antihistamines except Claritin/Loratadine and Zyrtec/Cetrizine. 2. Avoid all combination including cold sinus allergies flu decongestant and sleep medications 3. You can use Robitussin DM Mucinex and Mucinex DM for cough. 4. can use Tylenol aspirin ibuprofen and naproxen but no combinations such as sleep or sinus. Screen with a Hortense Ramalkardia  KardiaMobile [email or web address]        FDA-cleared, clinical grade mobile EKG monitor: Lourena SimmondsKardia is the most clinically-validated mobile EKG used by the world's leading cardiac care medical professionals With Basic service, know instantly if your heart rhythm is normal or if atrial fibrillation is detected, and email the last single EKG recording to yourself or your doctor Premium service, available for purchase through the Kardia app for $9.99 per month or $99 per year, includes unlimited history and storage of your EKG recordings, a monthly EKG summary report to share with your doctor, along with the ability to track your blood pressure, activity and weight Includes one KardiaMobile phone clip FREE SHIPPING: Standard delivery 1-3 business days. Orders placed by 11:00am PST will ship that afternoon. Otherwise, will ship next business day. All orders ship via [employer]  Priority Mail from [place]

## 2017-05-24 ENCOUNTER — Other Ambulatory Visit: Payer: Self-pay

## 2017-05-24 DIAGNOSIS — I480 Paroxysmal atrial fibrillation: Secondary | ICD-10-CM

## 2017-05-24 MED ORDER — METOPROLOL SUCCINATE ER 25 MG PO TB24: 12.5000 mg | ORAL_TABLET | Freq: Every day | ORAL | 3 refills | Status: DC

## 2017-05-26 ENCOUNTER — Other Ambulatory Visit (HOSPITAL_COMMUNITY): Payer: Self-pay | Admitting: Nurse Practitioner

## 2017-08-09 ENCOUNTER — Other Ambulatory Visit: Payer: Self-pay | Admitting: Cardiology

## 2017-09-04 IMAGING — DX DG CHEST 1V PORT
2 series · 2 of 2 positions shown · non-contrast
Comparison: None.

CLINICAL DATA: Shortness of breath and atrial fibrillation.

EXAM:
PORTABLE CHEST 1 VIEW

[chest ap (1 of 2)]
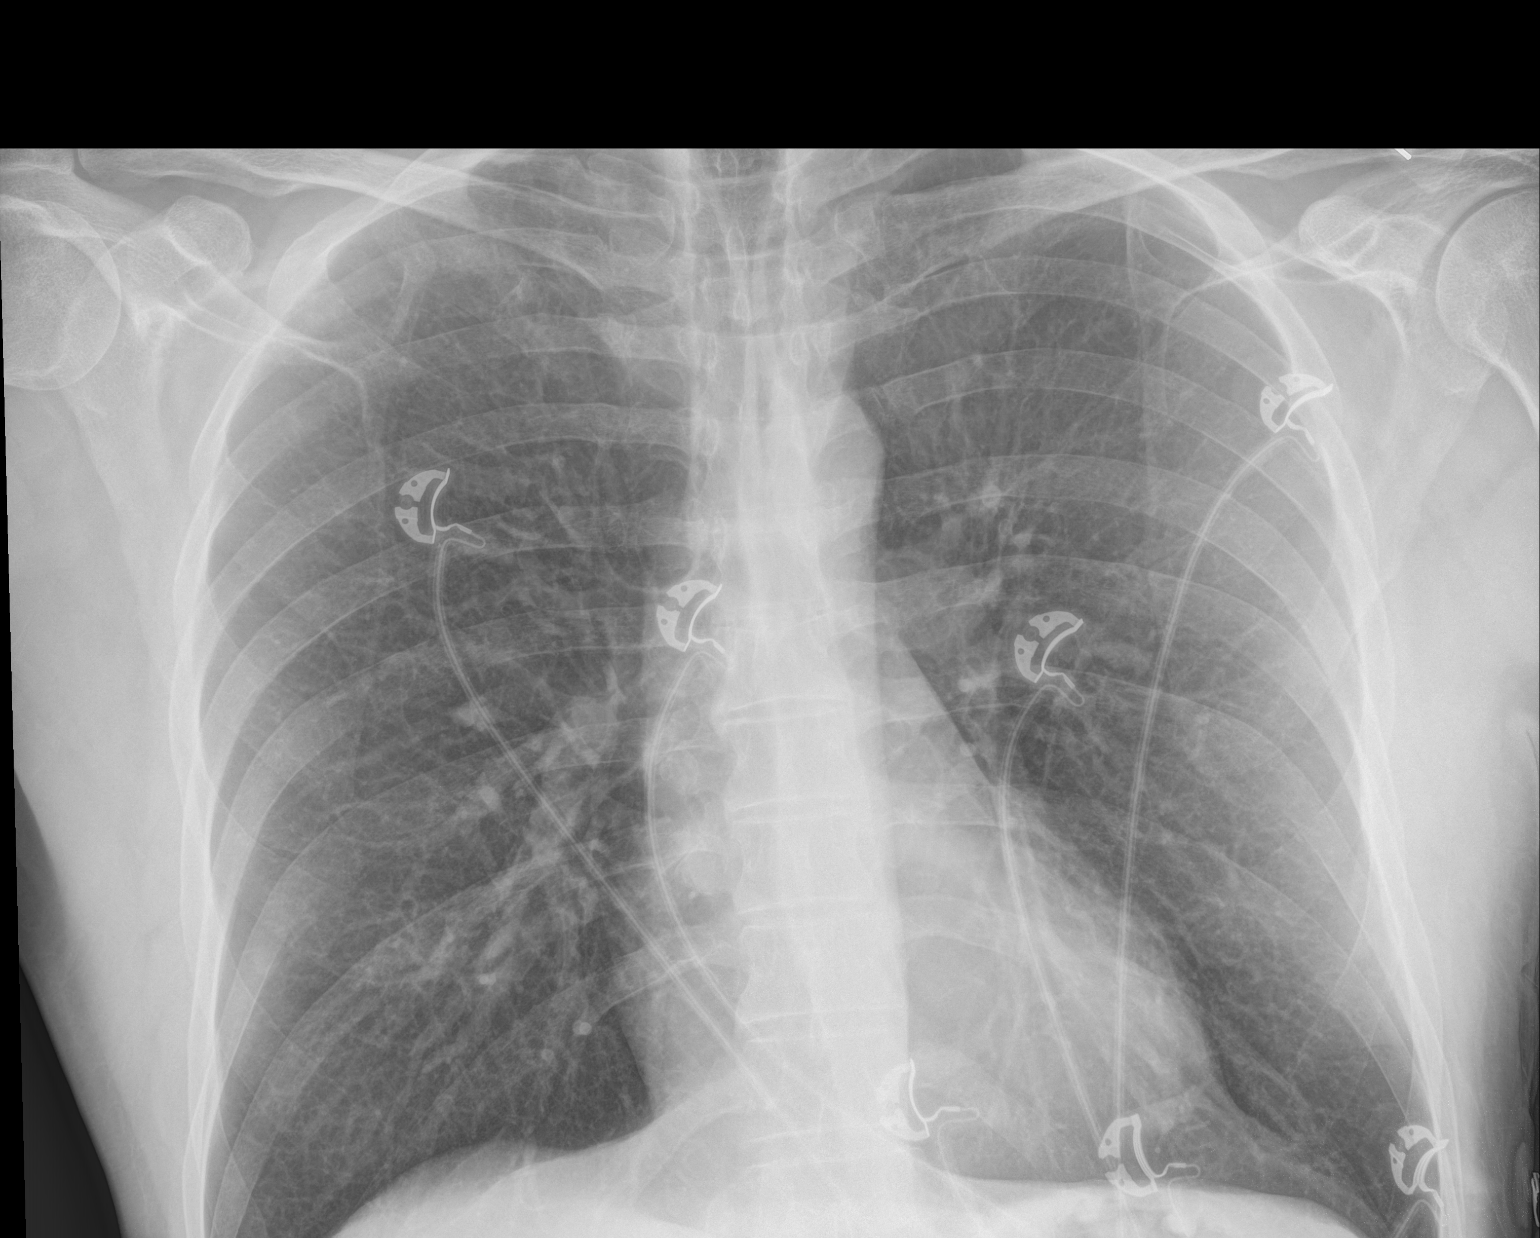

[chest ap (2 of 2)]
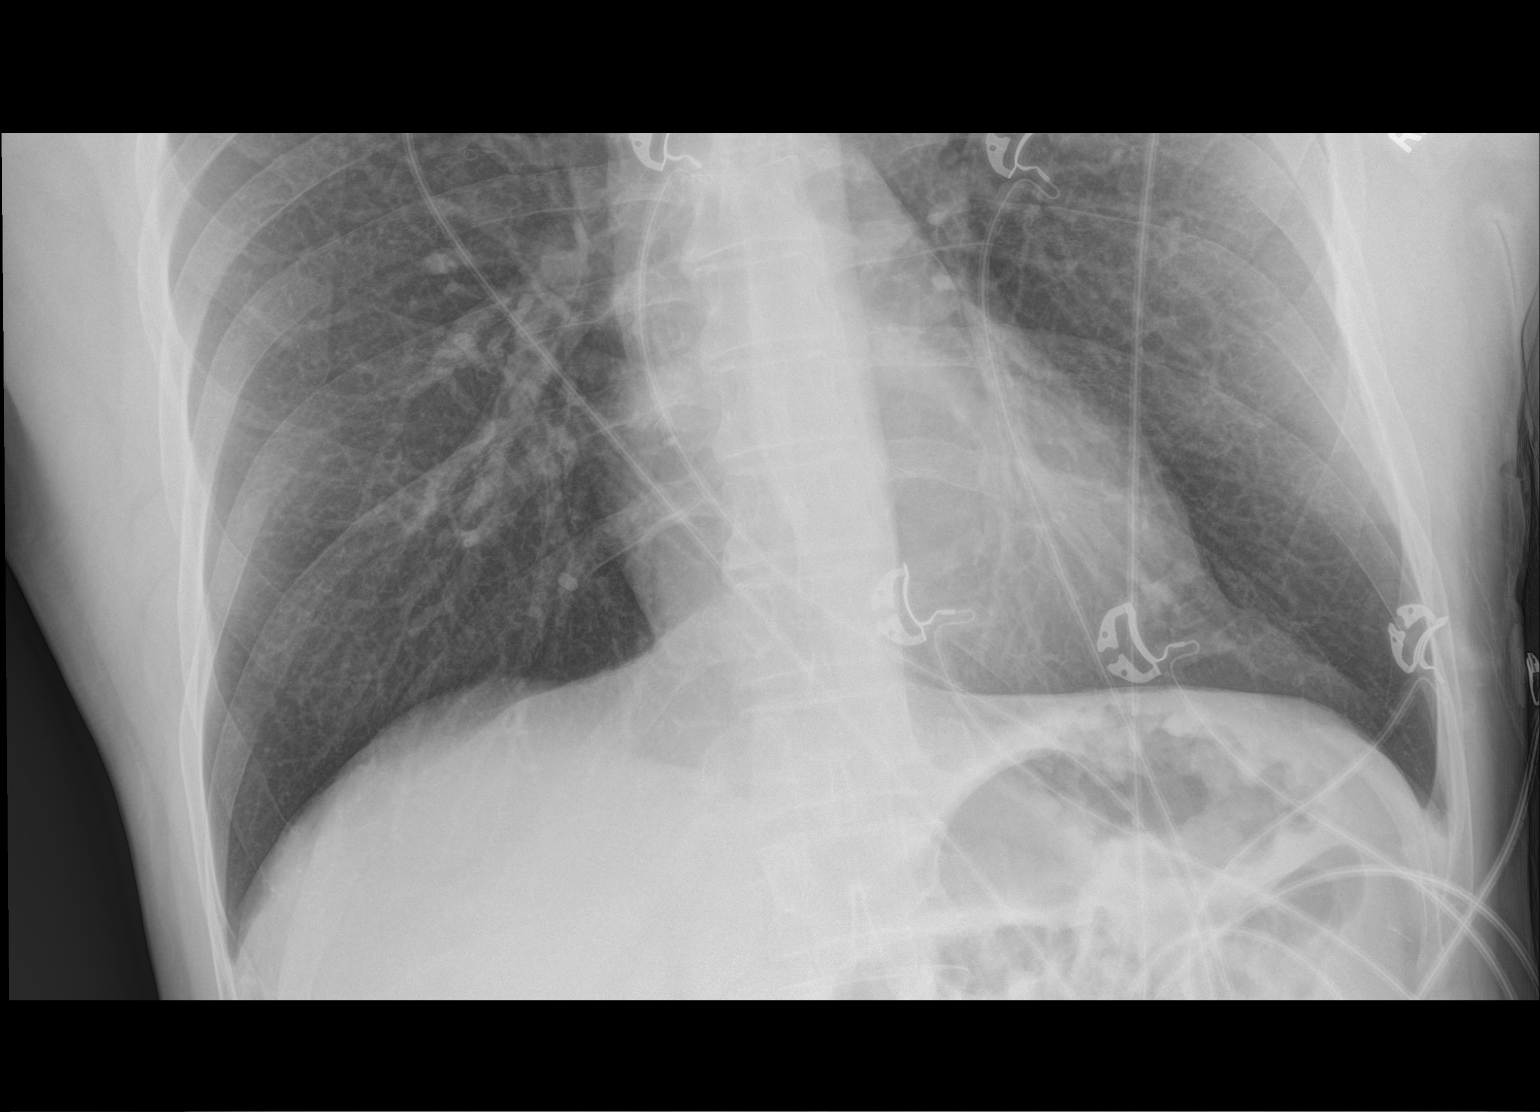

[2 of 2 positions shown; findings below may reference images not displayed]

FINDINGS: Cardiomediastinal silhouette is normal. Mediastinal contours appear
intact.

There is no evidence of focal airspace consolidation, pleural
effusion or pneumothorax. Mild right apical subpleural scarring.

Osseous structures are without acute abnormality. Soft tissues are
grossly normal.
IMPRESSION: No active disease.

## 2018-02-09 ENCOUNTER — Ambulatory Visit: Payer: BLUE CROSS/BLUE SHIELD | Admitting: Allergy and Immunology

## 2018-02-09 ENCOUNTER — Encounter: Payer: Self-pay | Admitting: Allergy and Immunology

## 2018-02-09 VITALS — BP 128/74 | HR 69 | Temp 98.6°F | Resp 18 | Ht 74.0 in | Wt 192.8 lb

## 2018-02-09 DIAGNOSIS — L500 Allergic urticaria: Secondary | ICD-10-CM | POA: Diagnosis not present

## 2018-02-09 DIAGNOSIS — T7840XA Allergy, unspecified, initial encounter: Secondary | ICD-10-CM | POA: Diagnosis not present

## 2018-02-09 MED ORDER — EPINEPHRINE 0.3 MG/0.3ML IJ SOAJ: 0.3000 mg | Freq: Once | INTRAMUSCULAR | 1 refills | Status: AC

## 2018-02-09 MED ORDER — EPINEPHRINE 0.3 MG/0.3ML IJ SOAJ: 0.3000 mg | Freq: Once | INTRAMUSCULAR | 1 refills | Status: DC

## 2018-02-09 NOTE — Patient Instructions (Addendum)
  1.  Allergen avoidance measures?  2.  AUVI-Q 0.3, Benadryl, MD/ER evaluation for allergic reaction  3.  Blood -nut panel with reflex  4.  Further evaluation?  Yes if recurrent reactions  5.  Obtain flu vaccine every year  6.  Beta-blocker?

## 2018-02-09 NOTE — Progress Notes (Signed)
Dear Dr. Tomasa Blase,  Thank you for referring Joshua Chavez to the [facility] [place] on 02/09/2018.   Below is a summation of this patient's evaluation and recommendations.  Thank you for your referral. I will keep you informed about this patient's response to treatment.   If you have any questions please do not hesitate to contact me.   Sincerely,  Jessica Priest, MD Allergy / Immunology [facility]   ______________________________________________________________________    NEW PATIENT NOTE  Referring Provider: Paulina Fusi, MD Primary Provider: Paulina Fusi, MD Date of office visit: 02/09/2018    Subjective:   Chief Complaint:  Joshua Chavez (DOB: [birth year]) is a 52 y.o. male who presents to the clinic on 02/09/2018 with a chief complaint of Rash and Allergic Reaction .     HPI: Abhi presents to this clinic in evaluation of an allergic reaction that occurred approximately 3 months ago.  Apparently Onalee Hua ate a salad with several different types of nuts which was a new food item for him.  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  He became itchy about 1 and half hours into this job and he remained itchy for 1 and half hours while completing his job and when he was removed from his protective gear he found that he had red raised itchy lesions across his body that progressed for which he took a Benadryl and within an hour or so this reaction resolved.  The next day he ate the same salad with a chicken patty and redeveloped this skin reaction within 1 hour for which he took a Benadryl and his reaction resolved but he had recrudescence of his urticaria later that night and he took another Benadryl.  At no point in time did he ever have  any associated systemic or constitutional symptoms associated with this reaction.  Since that point in time he has eaten chicken patties without any difficulty and he eats almonds and pecans and sunflower seeds without any difficulty.  He did not take any over-the-counter medicines that he can remember around that point in time and he did not appear to have any symptoms suggesting of a infectious disease around that point in time.  He does have very slight seasonal allergic rhinitis that does not require any therapy but no other atopic disease.  Past Medical History:  Diagnosis Date  . Arrhythmia   . Arthritis   . GERD (gastroesophageal reflux disease)   . Hyperlipidemia     Past Surgical History:  Procedure Laterality Date  . ENDOSCOPIC PLANTAR FASCIOTOMY Left   . HERNIA REPAIR    . HERNIA REPAIR Bilateral   . shouler Left    shoulder  . WISDOM TOOTH EXTRACTION Bilateral     Allergies as of 02/09/2018   No Known Allergies     Medication List      ANDROGEL PUMP 20.25 MG/ACT (1.62%) Gel Generic drug:  Testosterone   b complex vitamins capsule Take 1 capsule by mouth daily.   Magnesium 250 MG Tabs Take 250 mg by mouth daily.   Melatonin 1 MG Tabs Take 1 mg by mouth at bedtime as needed.   metoprolol succinate 25 MG 24 hr tablet Commonly known as:  TOPROL-XL Take 0.5 tablets (12.5 mg  total) by mouth daily.   omeprazole 20 MG capsule Commonly known as:  PRILOSEC Take 20 mg by mouth 2 (two) times daily before a meal.   valACYclovir 1000 MG tablet Commonly known as:  VALTREX Take 1,000 mg by mouth daily as needed.   vitamin C 1000 MG tablet Take 1,000 mg by mouth daily.       Review of systems negative except as noted in HPI / PMHx or noted below:  Review of Systems  Constitutional: Negative.   HENT: Negative.   Eyes: Negative.   Respiratory: Negative.   Cardiovascular: Negative.   Gastrointestinal: Negative.   Genitourinary: Negative.   Musculoskeletal:  Negative.   Skin: Negative.   Neurological: Negative.   Endo/Heme/Allergies: Negative.   Psychiatric/Behavioral: Negative.     Family History  Problem Relation Age of Onset  . Hypertension Mother   . Colon cancer Maternal Grandfather     Social History   Socioeconomic History  . Marital status: Married    Spouse name: Not on file  . Number of children: Not on file  . Years of education: Not on file  . Highest education level: Not on file  Occupational History  . Not on file  Social Needs  . Financial resource strain: Not on file  . Food insecurity:    Worry: Not on file    Inability: Not on file  . Transportation needs:    Medical: Not on file    Non-medical: Not on file  Tobacco Use  . Smoking status: Former Games developer  . Smokeless tobacco: Never Used  Substance and Sexual Activity  . Alcohol use: Yes    Comment: occ  . Drug use: No  . Sexual activity: Not on file  Lifestyle  . Physical activity:    Days per week: Not on file    Minutes per session: Not on file  . Stress: Not on file  Relationships  . Social connections:    Talks on phone: Not on file    Gets together: Not on file    Attends religious service: Not on file    Active member of club or organization: Not on file    Attends meetings of clubs or organizations: Not on file    Relationship status: Not on file  . Intimate partner violence:    Fear of current or ex partner: Not on file    Emotionally abused: Not on file    Physically abused: Not on file    Forced sexual activity: Not on file  Other Topics Concern  . Not on file  Social History Narrative  . Not on file    Environmental and Social history  Lives in a house with a dry environment, a cat occasionally located inside the household, carpet in the bedroom, plastic on the bed, plastic on the pillow, no smokers located inside the household.  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 Head is without right periorbital erythema and without left periorbital erythema.  Right Ear: Tympanic membrane, external ear and ear canal normal.  Left Ear: Tympanic membrane, external ear and ear canal normal.  Nose: Nose normal. No mucosal edema or rhinorrhea.  Mouth/Throat: Oropharynx is clear and moist and mucous membranes are normal. No oropharyngeal exudate.  Eyes: Pupils are equal, round, and reactive to light. Conjunctivae and lids are normal.  Neck: Trachea normal. No tracheal deviation present. No thyromegaly present.  Cardiovascular: Normal rate, regular rhythm, S1 normal, S2 normal and normal heart sounds.  No murmur heard. Pulmonary/Chest: Effort normal. No stridor. No respiratory distress. He has no wheezes. He has no rales. He exhibits no tenderness.  Abdominal: Soft. He exhibits no distension and no mass. There is no hepatosplenomegaly. There is no tenderness. There is no rebound and no guarding.  Musculoskeletal: He exhibits no edema or tenderness.  Lymphadenopathy:       Head (right side): No tonsillar adenopathy present.       Head (left side): No tonsillar adenopathy present.    He has no cervical adenopathy.    He has no axillary adenopathy.  Neurological: He is alert.  Skin: No rash noted. He is not diaphoretic. No erythema. No pallor. Nails show no clubbing.    Diagnostics: Allergy skin tests were performed. He did not demonstrate any hypersensitivity against a screening panel of foods.  Assessment and Plan:    1. Allergic reaction, initial encounter   2. Allergic urticaria     1.  Allergen avoidance measures?  2.  AUVI-Q 0.3, Benadryl, MD/ER evaluation for allergic reaction  3.  Blood -nut panel with reflex  4.  Further evaluation?  Yes if recurrent  reactions  5.  Obtain flu vaccine every year  6.  Beta-blocker?  Jamear had some form of immunological hyperreactivity sometime ago with unknown etiologic agent.  We will explore this issue in a little more detail by checking a nut IgE panel.  We have given him an injectable epinephrine device to use should he ever develop a significant allergic reaction in the future.  He is on a beta-blocker which is a relative contraindication for individuals with allergic reactions. However, he does appear to have a very good medical reason for using a beta-blocker at this point.  I will contact him with the results of his blood test once they are available for review.  Jessica Priest, MD Allergy / Immunology [facility]

## 2018-02-10 ENCOUNTER — Encounter: Payer: Self-pay | Admitting: Allergy and Immunology

## 2018-02-12 LAB — IGE NUT PROF. W/COMPONENT RFLX
Brazil Nut IgE: <0.1 kU/L
F020-IgE Almond: <0.1 kU/L
F202-IgE Cashew Nut: <0.1 kU/L
F203-IgE Pistachio Nut: <0.1 kU/L
Hazelnut (Filbert) IgE: <0.1 kU/L
Macadamia Nut, IgE: <0.1 kU/L
Peanut, IgE: <0.1 kU/L
Pecan Nut IgE: <0.1 kU/L
Walnut IgE: <0.1 kU/L

## 2018-02-12 LAB — ALLERGEN SESAME F10: Sesame Seed IgE: <0.1 kU/L

## 2018-02-14 ENCOUNTER — Other Ambulatory Visit: Payer: Self-pay | Admitting: Cardiology

## 2018-02-14 DIAGNOSIS — I480 Paroxysmal atrial fibrillation: Secondary | ICD-10-CM

## 2018-02-14 MED ORDER — METOPROLOL SUCCINATE ER 25 MG PO TB24: 12.5000 mg | ORAL_TABLET | Freq: Every day | ORAL | 1 refills | Status: DC

## 2018-02-14 NOTE — Telephone Encounter (Signed)
Rx sent to pharmacy as requested.

## 2018-02-14 NOTE — Telephone Encounter (Signed)
° ° °  1. Which medications need to be refilled? (please list name of each medication and dose if known) metoprol 25mg   2. Which pharmacy/location (including street and city if local pharmacy) is medication to be sent to? [facility]  3. Do they need a 30 day or 90 day supply? 90

## 2018-07-29 ENCOUNTER — Other Ambulatory Visit: Payer: Self-pay

## 2018-07-29 DIAGNOSIS — I480 Paroxysmal atrial fibrillation: Secondary | ICD-10-CM

## 2018-07-29 MED ORDER — METOPROLOL SUCCINATE ER 25 MG PO TB24: 12.5000 mg | ORAL_TABLET | Freq: Every day | ORAL | 2 refills | Status: DC

## 2018-08-01 ENCOUNTER — Other Ambulatory Visit: Payer: Self-pay

## 2018-08-01 DIAGNOSIS — I480 Paroxysmal atrial fibrillation: Secondary | ICD-10-CM

## 2018-08-01 MED ORDER — METOPROLOL SUCCINATE ER 25 MG PO TB24: 12.5000 mg | ORAL_TABLET | Freq: Every day | ORAL | 2 refills | Status: DC

## 2018-08-19 ENCOUNTER — Telehealth: Payer: Self-pay

## 2018-08-19 DIAGNOSIS — I480 Paroxysmal atrial fibrillation: Secondary | ICD-10-CM

## 2018-08-19 MED ORDER — METOPROLOL SUCCINATE ER 25 MG PO TB24: 12.5000 mg | ORAL_TABLET | Freq: Every day | ORAL | 0 refills | Status: DC

## 2018-08-19 NOTE — Telephone Encounter (Signed)
Patient request medication refill on metoprolol. 90 days sent with skype message to Mathis Fare to call and schedule pt for 1 year follow up with Dr. Dulce Sellar.

## 2018-11-03 ENCOUNTER — Encounter: Payer: Self-pay | Admitting: Gastroenterology

## 2018-11-14 ENCOUNTER — Other Ambulatory Visit: Payer: Self-pay

## 2018-11-14 ENCOUNTER — Telehealth (INDEPENDENT_AMBULATORY_CARE_PROVIDER_SITE_OTHER): Payer: 59 | Admitting: Gastroenterology

## 2018-11-14 ENCOUNTER — Encounter: Payer: Self-pay | Admitting: Gastroenterology

## 2018-11-14 VITALS — Ht 74.0 in | Wt 195.0 lb

## 2018-11-14 DIAGNOSIS — K219 Gastro-esophageal reflux disease without esophagitis: Secondary | ICD-10-CM | POA: Diagnosis not present

## 2018-11-14 DIAGNOSIS — Z86010 Personal history of colonic polyps: Secondary | ICD-10-CM

## 2018-11-14 DIAGNOSIS — R1310 Dysphagia, unspecified: Secondary | ICD-10-CM

## 2018-11-14 MED ORDER — DEXILANT 60 MG PO CPDR: 60.0000 mg | DELAYED_RELEASE_CAPSULE | Freq: Every day | ORAL | 6 refills | Status: DC

## 2018-11-14 NOTE — Progress Notes (Signed)
Chief Complaint:   Referring Provider:  Nicoletta Dress, MD      ASSESSMENT AND PLAN;   #1.  GERD with small HH/EE (03/2016).  Failed multiple PPIs including omeprazole. #2.  Odynophagia. #3.  H/O colonic polyps 03/2016.  Next colonoscopy due 03/2021  Plan: -Change omeprazole to Dexilant 60 mg p.o. QD. #30, 6 refills. -Brochures regarding GERD. -Avoid coffee, citrus foods like lemonade/orange juice etc. -EGD in 3 to 4 weeks. -Avoid nonsteroidals.   HPI:    Joshua Chavez is a 53 y.o. male  With significant heartburn, odynophagia without dysphagia Very much concerned about esophageal cancers. Omeprazole has been increased from 20 to 40mg  by Dr. Delena Bali over 6 months ago. Has been taking it every day and has still been using several antacids. Cutting down coffee did help.  No fever chills night sweats.  No recent weight loss.  He denies having any significant diarrhea or constipation.  Does have occasional throat pain.  Past Medical History:  Diagnosis Date  . Anxiety   . Arrhythmia   . Arthritis   . GERD (gastroesophageal reflux disease)   . Herpes simplex   . Hyperlipidemia   . IBS (irritable bowel syndrome)     Past Surgical History:  Procedure Laterality Date  . COLONOSCOPY  04/08/2016   Colonic polyp status post polypectomy. Small internal hemorrhoids.   . ENDOSCOPIC PLANTAR FASCIOTOMY Left   . ESOPHAGOGASTRODUODENOSCOPY  04/08/2016   Erosive esophagitis. Small hiatal hernia.   Marland Kitchen HERNIA REPAIR    . HERNIA REPAIR Bilateral   . shouler Left    shoulder  . WISDOM TOOTH EXTRACTION Bilateral     Family History  Problem Relation Age of Onset  . Hypertension Mother   . Colon cancer Maternal Grandfather     Social History   Tobacco Use  . Smoking status: Former Research scientist (life sciences)  . Smokeless tobacco: Never Used  Substance Use Topics  . Alcohol use: Yes    Comment: occ  . Drug use: No    Current Outpatient Medications  Medication Sig Dispense Refill   . ANDROGEL PUMP 20.25 MG/ACT (1.62%) GEL     . Ascorbic Acid (VITAMIN C) 1000 MG tablet Take 1,000 mg by mouth daily.    Marland Kitchen b complex vitamins capsule Take 1 capsule by mouth daily.    . Magnesium 250 MG TABS Take 250 mg by mouth daily.    . Melatonin 1 MG TABS Take 1 mg by mouth at bedtime as needed.    . metoprolol succinate (TOPROL-XL) 25 MG 24 hr tablet Take 0.5 tablets (12.5 mg total) by mouth daily. 45 tablet 0  . omeprazole (PRILOSEC) 20 MG capsule Take 20 mg by mouth 2 (two) times daily before a meal.     . valACYclovir (VALTREX) 1000 MG tablet Take 1,000 mg by mouth daily as needed.      No current facility-administered medications for this visit.     No Known Allergies  Review of Systems:  neg Has situational anxiety or depression     Physical Exam:    Ht 6\' 2"  (1.88 m)   Wt 195 lb (88.5 kg)   BMI 25.04 kg/m  Filed Weights   11/14/18 1346  Weight: 195 lb (88.5 kg)   televisit  Data Reviewed: I have personally reviewed following labs and imaging studies  CBC: CBC Latest Ref Rng & Units 11/07/2016  WBC 4.0 - 10.5 K/uL 7.1  Hemoglobin 13.0 - 17.0 g/dL 16.1  Hematocrit 39.0 -  52.0 % 44.3  Platelets 150 - 400 K/uL 179    CMP: CMP Latest Ref Rng & Units 11/07/2016  Glucose 65 - 99 mg/dL 161(W113(H)  BUN 6 - 20 mg/dL 17  Creatinine 9.600.61 - 4.541.24 mg/dL 0.980.85  Sodium 119135 - 147145 mmol/L 139  Potassium 3.5 - 5.1 mmol/L 3.7  Chloride 101 - 111 mmol/L 105  CO2 22 - 32 mmol/L 22  Calcium 8.9 - 10.3 mg/dL 9.6  Total Protein 6.5 - 8.1 g/dL 7.7  Total Bilirubin 0.3 - 1.2 mg/dL 1.2  Alkaline Phos 38 - 126 U/L 42  AST 15 - 41 U/L 25  ALT 17 - 63 U/L 28   This service was provided via telemedicine.  The patient was located at work.  The provider was located in work.  The patient did consent to this telephone visit and is aware of possible charges through their insurance for this visit.  The patient was referred by Dr. Tomasa BlaseSchultz.    Time spent on call: 15 min     Edman Circleaj Stormie Ventola, MD  11/14/2018, 2:22 PM  Cc: Paulina FusiSchultz, Douglas E, MD

## 2018-11-14 NOTE — Patient Instructions (Addendum)
If you are age 10365 or older, your body mass index should be between 23-30. Your Body mass index is 25.04 kg/m. If this is out of the aforementioned range listed, please consider follow up with your Primary Care Provider.  If you are age 53 or younger, your body mass index should be between 19-25. Your Body mass index is 25.04 kg/m. If this is out of the aformentioned range listed, please consider follow up with your Primary Care Provider.   Stop taking Omeprazole  We have sent the following medications to your pharmacy for you to pick up at your convenience: Dexilant  Food Choices for Gastroesophageal Reflux Disease, Adult When you have gastroesophageal reflux disease (GERD), the foods you eat and your eating habits are very important. Choosing the right foods can help ease your discomfort. Think about working with a nutrition specialist (dietitian) to help you make good choices. What are tips for following this plan?  Meals  Choose healthy foods that are low in fat, such as fruits, vegetables, whole grains, low-fat dairy products, and lean meat, fish, and poultry.  Eat small meals often instead of 3 large meals a day. Eat your meals slowly, and in a place where you are relaxed. Avoid bending over or lying down until 2-3 hours after eating.  Avoid eating meals 2-3 hours before bed.  Avoid drinking a lot of liquid with meals.  Cook foods using methods other than frying. Bake, grill, or broil food instead.  Avoid or limit: ? Chocolate. ? Peppermint or spearmint. ? Alcohol. ? Pepper. ? Black and decaffeinated coffee. ? Black and decaffeinated tea. ? Bubbly (carbonated) soft drinks. ? Caffeinated energy drinks and soft drinks.  Limit high-fat foods such as: ? Fatty meat or fried foods. ? Whole milk, cream, butter, or ice cream. ? Nuts and nut butters. ? Pastries, donuts, and sweets made with butter or shortening.  Avoid foods that cause symptoms. These foods may be different for  everyone. Common foods that cause symptoms include: ? Tomatoes. ? Oranges, lemons, and limes. ? Peppers. ? Spicy food. ? Onions and garlic. ? Vinegar. Lifestyle  Maintain a healthy weight. Ask your doctor what weight is healthy for you. If you need to lose weight, work with your doctor to do so safely.  Exercise for at least 30 minutes for 5 or more days each week, or as told by your doctor.  Wear loose-fitting clothes.  Do not smoke. If you need help quitting, ask your doctor.  Sleep with the head of your bed higher than your feet. Use a wedge under the mattress or blocks under the bed frame to raise the head of the bed. Summary  When you have gastroesophageal reflux disease (GERD), food and lifestyle choices are very important in easing your symptoms.  Eat small meals often instead of 3 large meals a day. Eat your meals slowly, and in a place where you are relaxed.  Limit high-fat foods such as fatty meat or fried foods.  Avoid bending over or lying down until 2-3 hours after eating.  Avoid peppermint and spearmint, caffeine, alcohol, and chocolate. This information is not intended to replace advice given to you by your health care provider. Make sure you discuss any questions you have with your health care provider. Document Released: 10/27/2011 Document Revised: 08/18/2018 Document Reviewed: 06/02/2016 Elsevier Patient Education  2020 [employer]   Avoid NSAIDS.   You have been scheduled for an endoscopy. Please follow written instructions given to you at  your visit today. If you use inhalers (even only as needed), please bring them with you on the day of your procedure. Your physician has requested that you go to www.startemmi.com and enter the access code given to you at your visit today. This web site gives a general overview about your procedure. However, you should still follow specific instructions given to you by our office regarding your preparation for the  procedure.  You will be due for a recall colonoscopy in 03/2021. We will send you a reminder in the mail when it gets closer to that time.  Thank you,  Dr. Jackquline Denmark

## 2018-12-01 ENCOUNTER — Encounter: Payer: 59 | Admitting: Gastroenterology

## 2018-12-29 ENCOUNTER — Other Ambulatory Visit: Payer: Self-pay | Admitting: Cardiology

## 2018-12-29 DIAGNOSIS — I480 Paroxysmal atrial fibrillation: Secondary | ICD-10-CM

## 2019-02-09 ENCOUNTER — Ambulatory Visit (INDEPENDENT_AMBULATORY_CARE_PROVIDER_SITE_OTHER): Payer: 59 | Admitting: Cardiology

## 2019-02-09 ENCOUNTER — Encounter: Payer: Self-pay | Admitting: Cardiology

## 2019-02-09 ENCOUNTER — Other Ambulatory Visit: Payer: Self-pay

## 2019-02-09 VITALS — BP 122/80 | HR 75 | Temp 96.9°F | Ht 74.0 in | Wt 187.0 lb

## 2019-02-09 DIAGNOSIS — I480 Paroxysmal atrial fibrillation: Secondary | ICD-10-CM | POA: Diagnosis not present

## 2019-02-09 DIAGNOSIS — E785 Hyperlipidemia, unspecified: Secondary | ICD-10-CM | POA: Diagnosis not present

## 2019-02-09 MED ORDER — METOPROLOL SUCCINATE ER 25 MG PO TB24: 12.5000 mg | ORAL_TABLET | Freq: Every day | ORAL | 3 refills | Status: DC

## 2019-02-09 NOTE — Progress Notes (Signed)
Cardiology Office Note:    Date:  02/09/2019   ID:  Joshua Chavez, DOB Jul 03, 1965, MRN [id number]  PCP:  Paulina Fusi, MD  Cardiologist:  Norman Herrlich, MD    Referring MD: Paulina Fusi, MD    ASSESSMENT:    1. PAF (paroxysmal atrial fibrillation) (HCC)   2. Hyperlipidemia, unspecified hyperlipidemia type    PLAN:    In order of problems listed above:  1. He has had one single episode of brief atrial fibrillation no clinical recurrence has no risk factors for stroke at this time I would not consider antiarrhythmic drug or anticoagulant but I asked him to consider buying the iPhone adapter screen heart rhythm at home.  He is aware that excess alcohol can be up inciting agent and is trying to limit alcohol caffeine and stress in his personal life. 2. He has severe dyslipidemia LDL of 190 does not want to take a statin and in order to further refine his risk undergo artery calcium CT score.  If the calcium score is 0 or less than 10 he is low risk greater than 10 he is at increased cardiovascular risk would benefit from statin or non-statin lipid-lowering therapy and if he has a very high calcium score would need an ischemia evaluation.   Next appointment: 1 year   Medication Adjustments/Labs and Tests Ordered: Current medicines are reviewed at length with the patient today.  Concerns regarding medicines are outlined above.  Orders Placed This Encounter  Procedures  . CT CARDIAC SCORING  . EKG 12-Lead   Meds ordered this encounter  Medications  . metoprolol succinate (TOPROL-XL) 25 MG 24 hr tablet    Sig: Take 0.5 tablets (12.5 mg total) by mouth daily.    Dispense:  45 tablet    Refill:  3    Please advise patient to schedule 1 year follow up visit for future refills.    Chief Complaint  Patient presents with  . Follow-up  . Atrial Fibrillation    History of Present Illness:    Joshua Chavez is a 53 y.o. male with a hx of PAF CHADS2vasc of 0  last seen  05/20/2017.  Echocardiogram performed in 2018 was normal including atrial size left and right ventricular function and no valvular abnormality. Compliance with diet, lifestyle and medications: Yes  Has had some mild palpitation brief momentary not sustained and does not perceive a history of recurrent atrial fibrillation.  He has severe dyslipidemia wants to avoid medications but is interested in a calcium score to guide treatment and further evaluation.  He is willing to pay out-of-pocket will be scheduled.  No angina dyspnea syncope or TIA. Past Medical History:  Diagnosis Date  . Anxiety   . Arrhythmia   . Arthritis   . GERD (gastroesophageal reflux disease)   . Herpes simplex   . Hyperlipidemia   . IBS (irritable bowel syndrome)     Past Surgical History:  Procedure Laterality Date  . COLONOSCOPY  04/08/2016   Colonic polyp status post polypectomy. Small internal hemorrhoids.   . ENDOSCOPIC PLANTAR FASCIOTOMY Left   . ESOPHAGOGASTRODUODENOSCOPY  04/08/2016   Erosive esophagitis. Small hiatal hernia.   Marland Kitchen HERNIA REPAIR    . HERNIA REPAIR Bilateral   . shouler Left    shoulder  . WISDOM TOOTH EXTRACTION Bilateral     Current Medications: Current Meds  Medication Sig  . ANDROGEL PUMP 20.25 MG/ACT (1.62%) GEL   . Ascorbic Acid (VITAMIN C) 1000 MG tablet Take  1,000 mg by mouth daily.  Marland Kitchen. b complex vitamins capsule Take 1 capsule by mouth every other day.   . Magnesium 250 MG TABS Take 250 mg by mouth daily.  . Melatonin 1 MG TABS Take 1 mg by mouth at bedtime as needed.  . metoprolol succinate (TOPROL-XL) 25 MG 24 hr tablet Take 0.5 tablets (12.5 mg total) by mouth daily.  Marland Kitchen. omeprazole (PRILOSEC) 20 MG capsule Take 40 mg by mouth daily.   . valACYclovir (VALTREX) 1000 MG tablet Take 1,000 mg by mouth daily as needed.   . [DISCONTINUED] metoprolol succinate (TOPROL-XL) 25 MG 24 hr tablet Take 0.5 tablets (12.5 mg total) by mouth daily.     Allergies:   Patient has no known  allergies.   Social History   Socioeconomic History  . Marital status: Married    Spouse name: Not on file  . Number of children: Not on file  . Years of education: Not on file  . Highest education level: Not on file  Occupational History  . Not on file  Social Needs  . Financial resource strain: Not on file  . Food insecurity    Worry: Not on file    Inability: Not on file  . Transportation needs    Medical: Not on file    Non-medical: Not on file  Tobacco Use  . Smoking status: Former Smoker    Types: Cigarettes    Quit date: 2005    Years since quitting: 15.7  . Smokeless tobacco: Never Used  Substance and Sexual Activity  . Alcohol use: Not Currently  . Drug use: No  . Sexual activity: Not on file  Lifestyle  . Physical activity    Days per week: Not on file    Minutes per session: Not on file  . Stress: Not on file  Relationships  . Social Musicianconnections    Talks on phone: Not on file    Gets together: Not on file    Attends religious service: Not on file    Active member of club or organization: Not on file    Attends meetings of clubs or organizations: Not on file    Relationship status: Not on file  Other Topics Concern  . Not on file  Social History Narrative  . Not on file     Family History: The patient's family history includes Colon cancer in his maternal grandfather; Hypertension in his mother. ROS:   Please see the history of present illness.    All other systems reviewed and are negative.  EKGs/Labs/Other Studies Reviewed:    The following studies were reviewed today:  EKG:  EKG ordered today and personally reviewed.  The ekg ordered today demonstrates sinus rhythm and is normal there is no atrial abnormality  Recent Labs: No results found for requested labs within last 8760 hours.  Recent Lipid Panel No results found for: CHOL, TRIG, HDL, CHOLHDL, VLDL, LDLCALC, LDLDIRECT  Physical Exam:    VS:  BP 122/80 (BP Location: Right Arm,  Patient Position: Sitting, Cuff Size: Normal)   Pulse 75   Temp (!) 96.9 F (36.1 C)   Ht 6\' 2"  (1.88 m)   Wt 187 lb 0.6 oz (84.8 kg)   SpO2 97%   BMI 24.01 kg/m     Wt Readings from Last 3 Encounters:  02/09/19 187 lb 0.6 oz (84.8 kg)  11/14/18 195 lb (88.5 kg)  02/09/18 192 lb 12.8 oz (87.5 kg)     GEN:  Well nourished, well developed in no acute distress HEENT: Normal NECK: No JVD; No carotid bruits LYMPHATICS: No lymphadenopathy CARDIAC: RRR, no murmurs, rubs, gallops RESPIRATORY:  Clear to auscultation without rales, wheezing or rhonchi  ABDOMEN: Soft, non-tender, non-distended MUSCULOSKELETAL:  No edema; No deformity  SKIN: Warm and dry NEUROLOGIC:  Alert and oriented x 3 PSYCHIATRIC:  Normal affect    Signed, Shirlee More, MD  02/09/2019 12:02 PM    [facility]

## 2019-02-09 NOTE — Patient Instructions (Addendum)
Medication Instructions:  Your physician recommends that you continue on your current medications as directed. Please refer to the Current Medication list given to you today.  If you need a refill on your cardiac medications before your next appointment, please call your pharmacy.   Lab work: None  If you have labs (blood work) drawn today and your tests are completely normal, you will receive your results only by: Marland Kitchen MyChart Message (if you have MyChart) OR . A paper copy in the mail If you have any lab test that is abnormal or we need to change your treatment, we will call you to review the results.  Testing/Procedures: You will have a CT cardiac/calcium scoring at the [facility] office located at [address] [place], [place] [address] on Wednesday, 03/22/2019 at 2:00 pm. Please arrive 15 minutes early. This test is not covered by your insurance and will cost $150 out of pocket. Please bring payment to your appointment.     Follow-Up: At [facility], you and your health needs are our priority.  As part of our continuing mission to provide you with exceptional heart care, we have created designated Provider Care Teams.  These Care Teams include your primary Cardiologist (physician) and Advanced Practice Providers (APPs -  Physician Assistants and Nurse Practitioners) who all work together to provide you with the care you need, when you need it. You will need a follow up appointment in 1 years.  Please call our office 2 months in advance to schedule this appointment.       Coronary Calcium Scan A coronary calcium scan is an imaging test used to look for deposits of calcium and other fatty materials (plaques) in the inner lining of the blood vessels of the heart (coronary arteries). These deposits of calcium and plaques can partly clog and narrow the coronary arteries without producing any symptoms or warning signs. This puts a person at risk for a heart attack. This test can  detect these deposits before symptoms develop. Tell a health care provider about:  Any allergies you have.  All medicines you are taking, including vitamins, herbs, eye drops, creams, and over-the-counter medicines.  Any problems you or family members have had with anesthetic medicines.  Any blood disorders you have.  Any surgeries you have had.  Any medical conditions you have.  Whether you are pregnant or may be pregnant. What are the risks? Generally, this is a safe procedure. However, problems may occur, including:  Harm to a pregnant woman and her unborn baby. This test involves the use of radiation. Radiation exposure can be dangerous to a pregnant woman and her unborn baby. If you are pregnant, you generally should not have this procedure done.  Slight increase in the risk of cancer. This is because of the radiation involved in the test. What happens before the procedure? No preparation is needed for this procedure. What happens during the procedure?   You will undress and remove any jewelry around your neck or chest.  You will put on a hospital gown.  Sticky electrodes will be placed on your chest. The electrodes will be connected to an electrocardiogram (ECG) machine to record a tracing of the electrical activity of your heart.  A CT scanner will take pictures of your heart. During this time, you will be asked to lie still and hold your breath for 2-3 seconds while a picture of your heart is being taken. The procedure may vary among health care providers and  hospitals. What happens after the procedure?  You can get dressed.  You can return to your normal activities.  It is up to you to get the results of your test. Ask your health care provider, or the department that is doing the test, when your results will be ready. Summary  A coronary calcium scan is an imaging test used to look for deposits of calcium and other fatty materials (plaques) in the inner lining of  the blood vessels of the heart (coronary arteries).  Generally, this is a safe procedure. Tell your health care provider if you are pregnant or may be pregnant.  No preparation is needed for this procedure.  A CT scanner will take pictures of your heart.  You can return to your normal activities after the scan is done. This information is not intended to replace advice given to you by your health care provider. Make sure you discuss any questions you have with your health care provider. Document Released: 10/24/2007 Document Revised: 04/09/2017 Document Reviewed: 03/16/2016 Elsevier Patient Education  2020 [employer].

## 2019-03-22 ENCOUNTER — Telehealth: Payer: Self-pay | Admitting: Cardiology

## 2019-03-22 ENCOUNTER — Ambulatory Visit (INDEPENDENT_AMBULATORY_CARE_PROVIDER_SITE_OTHER)
Admission: RE | Admit: 2019-03-22 | Discharge: 2019-03-22 | Disposition: A | Discharge status: Home / Self Care | Payer: Self-pay | Source: Ambulatory Visit | Attending: Cardiology | Admitting: Cardiology

## 2019-03-22 ENCOUNTER — Other Ambulatory Visit: Payer: Self-pay

## 2019-03-22 DIAGNOSIS — E785 Hyperlipidemia, unspecified: Secondary | ICD-10-CM

## 2019-03-22 DIAGNOSIS — R918 Other nonspecific abnormal finding of lung field: Secondary | ICD-10-CM

## 2019-03-22 DIAGNOSIS — I480 Paroxysmal atrial fibrillation: Secondary | ICD-10-CM

## 2019-03-22 NOTE — Telephone Encounter (Signed)
Joshua Chavez from [facility] would like a call regarding results at [phone]

## 2019-03-23 MED ORDER — ROSUVASTATIN CALCIUM 10 MG PO TABS: 10.0000 mg | ORAL_TABLET | Freq: Every day | ORAL | 3 refills | Status: DC

## 2019-03-23 NOTE — Telephone Encounter (Signed)
Spoke with Joshua Chavez at [facility] who is faxing a copy of the CT cardiac scoring report over for Dr. Bettina Gavia to review. Dr. Bettina Gavia made aware that a right pulmonary nodule was noted on this scan.

## 2019-03-23 NOTE — Telephone Encounter (Signed)
Dr. Bettina Gavia reviewed detailed report of CT calcium scoring and advised that since a 4 mm right pulmonary nodule was seen on the CT cardiac scoring a follow up CT chest will be ordered to recheck the status of this nodule in 12 months. Patient is agreeable. Order has been placed.  Patient informed of the rest of the CT calcium scoring results and advised to start taking rosuvastatin 10 mg daily per Dr. Bettina Gavia. Patient is agreeable and prescription has been sent to [facility] as requested. Patient verbalized understanding. No further questions.

## 2019-10-10 HISTORY — PX: CARDIOVERSION: SHX1299

## 2019-10-28 ENCOUNTER — Encounter (HOSPITAL_BASED_OUTPATIENT_CLINIC_OR_DEPARTMENT_OTHER): Payer: Self-pay | Admitting: Emergency Medicine

## 2019-10-28 ENCOUNTER — Other Ambulatory Visit: Payer: Self-pay

## 2019-10-28 ENCOUNTER — Emergency Department (HOSPITAL_BASED_OUTPATIENT_CLINIC_OR_DEPARTMENT_OTHER)
Admission: EM | Admit: 2019-10-28 | Discharge: 2019-10-28 | Disposition: A | Discharge status: Home / Self Care | Payer: 59 | Attending: Emergency Medicine | Admitting: Emergency Medicine

## 2019-10-28 ENCOUNTER — Emergency Department (HOSPITAL_BASED_OUTPATIENT_CLINIC_OR_DEPARTMENT_OTHER): Payer: 59

## 2019-10-28 DIAGNOSIS — Z87891 Personal history of nicotine dependence: Secondary | ICD-10-CM | POA: Insufficient documentation

## 2019-10-28 DIAGNOSIS — R002 Palpitations: Secondary | ICD-10-CM | POA: Diagnosis present

## 2019-10-28 DIAGNOSIS — R079 Chest pain, unspecified: Secondary | ICD-10-CM

## 2019-10-28 DIAGNOSIS — Z79899 Other long term (current) drug therapy: Secondary | ICD-10-CM | POA: Insufficient documentation

## 2019-10-28 DIAGNOSIS — I480 Paroxysmal atrial fibrillation: Secondary | ICD-10-CM

## 2019-10-28 HISTORY — DX: Unspecified atrial fibrillation: I48.91

## 2019-10-28 LAB — CBC WITH DIFFERENTIAL/PLATELET
Abs Immature Granulocytes: 0.02 10*3/uL (ref 0.00–0.07)
Basophils Absolute: 0 10*3/uL (ref 0.0–0.1)
Basophils Relative: 0 %
Eosinophils Absolute: 0.1 10*3/uL (ref 0.0–0.5)
Eosinophils Relative: 1 %
HCT: 44.1 % (ref 39.0–52.0)
Hemoglobin: 15.5 g/dL (ref 13.0–17.0)
Immature Granulocytes: 0 %
Lymphocytes Relative: 18 %
Lymphs Abs: 1.1 10*3/uL (ref 0.7–4.0)
MCH: 32.6 pg (ref 26.0–34.0)
MCHC: 35.1 g/dL (ref 30.0–36.0)
MCV: 92.8 fL (ref 80.0–100.0)
Monocytes Absolute: 0.5 10*3/uL (ref 0.1–1.0)
Monocytes Relative: 9 %
Neutro Abs: 4.3 10*3/uL (ref 1.7–7.7)
Neutrophils Relative %: 72 %
Platelets: 187 10*3/uL (ref 150–400)
RBC: 4.75 MIL/uL (ref 4.22–5.81)
RDW: 12 % (ref 11.5–15.5)
WBC: 6 10*3/uL (ref 4.0–10.5)
nRBC: 0 % (ref 0.0–0.2)

## 2019-10-28 LAB — COMPREHENSIVE METABOLIC PANEL
ALT: 39 U/L (ref 0–44)
AST: 30 U/L (ref 15–41)
Albumin: 4.2 g/dL (ref 3.5–5.0)
Alkaline Phosphatase: 44 U/L (ref 38–126)
Anion gap: 10 (ref 5–15)
BUN: 14 mg/dL (ref 6–20)
CO2: 23 mmol/L (ref 22–32)
Calcium: 8.9 mg/dL (ref 8.9–10.3)
Chloride: 103 mmol/L (ref 98–111)
Creatinine, Ser: 0.79 mg/dL (ref 0.61–1.24)
GFR calc Af Amer: >60 mL/min (ref >60)
GFR calc non Af Amer: >60 mL/min (ref >60)
Glucose, Bld: 107 mg/dL — ABNORMAL HIGH (ref 70–99)
Potassium: 4 mmol/L (ref 3.5–5.1)
Sodium: 136 mmol/L (ref 135–145)
Total Bilirubin: 1 mg/dL (ref 0.3–1.2)
Total Protein: 7.1 g/dL (ref 6.5–8.1)

## 2019-10-28 MED ORDER — SODIUM CHLORIDE 0.9 % IV BOLUS
1000.0000 mL | Freq: Once | INTRAVENOUS | Status: AC
  Administered 2019-10-28: 1000 mL via INTRAVENOUS

## 2019-10-28 MED ORDER — ETOMIDATE 2 MG/ML IV SOLN
8.0000 mg | Freq: Once | INTRAVENOUS | Status: DC
  Filled 2019-10-28: qty 10

## 2019-10-28 MED ORDER — ETOMIDATE 2 MG/ML IV SOLN
INTRAVENOUS | Status: AC | PRN
  Administered 2019-10-28: 8 mg via INTRAVENOUS

## 2019-10-28 NOTE — ED Triage Notes (Signed)
Pt states he had an episode of A-fib and R side chest pain yesterday. Symptoms have resolved.

## 2019-10-28 NOTE — Sedation Documentation (Signed)
Pt denies pain.

## 2019-10-28 NOTE — ED Notes (Signed)
Per EDP order, pt given fluids and/or food for PO challenge. Pt verbalized understanding to utilize call bell if nausea or emesis occur. 

## 2019-10-28 NOTE — ED Notes (Addendum)
Pt discharged to home. Discharge instructions have been discussed with patient and/or family members. Pt verbally acknowledges understanding d/c instructions, and endorses comprehension to checkout at registration before leaving. Pt accompanied by wife, Lawson Fiscal at discharge.

## 2019-10-28 NOTE — Sedation Documentation (Signed)
Unable to assess pain due to sedation.  

## 2019-10-28 NOTE — ED Notes (Signed)
ED Provider at bedside. 

## 2019-10-28 NOTE — ED Provider Notes (Addendum)
[facility] EMERGENCY DEPARTMENT Provider Note   CSN: [id number] Arrival date & time: 10/28/19  1251     History Chief Complaint  Patient presents with  . Palpitations    Joshua Chavez is a 54 y.o. male.  HPI      54 year old male with a history of paroxysmal atrial fibrillation, not on anticoagulation with last episode 3 years ago, hyperlipidemia, GERD, presents with concern for symptoms of atrial fibrillation and palpitations beginning last night.    Metoprolol, not on anticoagulation 11PM last night was tired, worked overtime yesterday, didn't drink enough thinks was dehydrated and may have triggered it HR last night was 140s beginning last night around 11PM Diltiazem took 11, 3am, 7 and 11am-HR improved Still having palpitations and symptoms of atrial fibrillation but not heart racing Pain working yesterday briefly on right had cramps No shortness of breath  No smoking, etoh, drugs No fevers, cough      Past Medical History:  Diagnosis Date  . Anxiety   . Arrhythmia   . Arthritis   . Atrial fibrillation (HCC)   . GERD (gastroesophageal reflux disease)   . Herpes simplex   . Hyperlipidemia   . IBS (irritable bowel syndrome)     Patient Active Problem List   Diagnosis Date Noted  . AF (paroxysmal atrial fibrillation) (HCC) 11/11/2016    Past Surgical History:  Procedure Laterality Date  . COLONOSCOPY  04/08/2016   Colonic polyp status post polypectomy. Small internal hemorrhoids.   . ENDOSCOPIC PLANTAR FASCIOTOMY Left   . ESOPHAGOGASTRODUODENOSCOPY  04/08/2016   Erosive esophagitis. Small hiatal hernia.   Marland Kitchen HERNIA REPAIR    . HERNIA REPAIR Bilateral   . shouler Left    shoulder  . WISDOM TOOTH EXTRACTION Bilateral        Family History  Problem Relation Age of Onset  . Hypertension Mother   . Colon cancer Maternal Grandfather     Social History   Tobacco Use  . Smoking status: Former Smoker    Types: Cigarettes    Quit  date: 2005    Years since quitting: 16.4  . Smokeless tobacco: Never Used  Vaping Use  . Vaping Use: Never used  Substance Use Topics  . Alcohol use: Not Currently  . Drug use: No    Home Medications Prior to Admission medications   Medication Sig Start Date End Date Taking? Authorizing Provider  co-enzyme Q-10 30 MG capsule Take 30 mg by mouth 3 (three) times daily.   Yes [provider]  ANDROGEL PUMP 20.25 MG/ACT (1.62%) GEL  09/03/16   [provider]  Ascorbic Acid (VITAMIN C) 1000 MG tablet Take 1,000 mg by mouth daily.    [provider]  b complex vitamins capsule Take 1 capsule by mouth every other day.     [provider]  Magnesium 250 MG TABS Take 250 mg by mouth daily.    [provider]  Melatonin 1 MG TABS Take 1 mg by mouth at bedtime as needed.    [provider]  metoprolol succinate (TOPROL-XL) 25 MG 24 hr tablet Take 0.5 tablets (12.5 mg total) by mouth daily. 02/09/19   Baldo Daub, MD  omeprazole (PRILOSEC) 20 MG capsule Take 40 mg by mouth daily.  09/03/16   [provider]  rosuvastatin (CRESTOR) 10 MG tablet Take 1 tablet (10 mg total) by mouth daily. 03/23/19 06/21/19  Baldo Daub, MD  valACYclovir (VALTREX) 1000 MG tablet Take 1,000 mg  by mouth daily as needed.  03/22/13   [provider]    Allergies    Patient has no known allergies.  Review of Systems   Review of Systems  Constitutional: Positive for fatigue. Negative for fever.  HENT: Negative for sore throat.   Eyes: Negative for visual disturbance.  Respiratory: Negative for shortness of breath.   Cardiovascular: Positive for palpitations. Negative for chest pain.  Gastrointestinal: Negative for abdominal pain, constipation, diarrhea, nausea and vomiting.  Genitourinary: Negative for difficulty urinating.  Musculoskeletal: Negative for back pain and neck stiffness.  Skin: Negative for rash.  Neurological: Negative for  syncope and headaches.    Physical Exam Updated Vital Signs BP 108/74   Pulse (!) 58   Temp 98.1 F (36.7 C) (Oral)   Resp 10   Ht 6\' 2"  (1.88 m)   Wt 81.6 kg   SpO2 100%   BMI 23.11 kg/m   Physical Exam Vitals and nursing note reviewed.  Constitutional:      General: He is not in acute distress.    Appearance: He is well-developed. He is not diaphoretic.  HENT:     Head: Normocephalic and atraumatic.  Eyes:     Conjunctiva/sclera: Conjunctivae normal.  Cardiovascular:     Rate and Rhythm: Tachycardia present. Rhythm irregular.     Heart sounds: Normal heart sounds. No murmur heard.  No friction rub. No gallop.   Pulmonary:     Effort: Pulmonary effort is normal. No respiratory distress.     Breath sounds: Normal breath sounds. No wheezing or rales.  Abdominal:     General: There is no distension.     Palpations: Abdomen is soft.     Tenderness: There is no abdominal tenderness. There is no guarding.  Musculoskeletal:     Cervical back: Normal range of motion.  Skin:    General: Skin is warm and dry.  Neurological:     Mental Status: He is alert and oriented to person, place, and time.     ED Results / Procedures / Treatments   Labs (all labs ordered are listed, but only abnormal results are displayed) Labs Reviewed  COMPREHENSIVE METABOLIC PANEL - Abnormal; Notable for the following components:      Result Value   Glucose, Bld 107 (*)    All other components within normal limits  CBC WITH DIFFERENTIAL/PLATELET    EKG EKG Interpretation  Date/Time:  Saturday October 28 2019 15:27:36 EDT Ventricular Rate:  72 PR Interval:    QRS Duration: 85 QT Interval:  381 QTC Calculation: 417 R Axis:   38 Text Interpretation: Sinus rhythm Since prior ECG, rhythm is now sinus Confirmed by 09-10-2005 (Alvira Monday) on 10/28/2019 3:37:37 PM   Radiology DG Chest Port 1 View  Result Date: 10/28/2019 CLINICAL DATA:  Chest pain.  Recent atrial fibrillation. EXAM:  PORTABLE CHEST 1 VIEW COMPARISON:  11/07/2016 FINDINGS: The heart size and mediastinal contours are within normal limits. Both lungs are clear. The visualized skeletal structures are unremarkable. IMPRESSION: Normal exam. Electronically Signed   By: 11/09/2016 M.D.   On: 10/28/2019 14:33    Procedures .Sedation  Date/Time: 10/28/2019 3:46 PM Performed by: 10/30/2019, MD Authorized by: Alvira Monday, MD   Consent:    Consent obtained:  Verbal   Consent given by:  Patient   Risks discussed:  Allergic reaction, dysrhythmia, inadequate sedation, nausea, prolonged hypoxia resulting in organ damage, prolonged sedation necessitating reversal, respiratory compromise necessitating ventilatory assistance and intubation and  vomiting   Alternatives discussed:  Analgesia without sedation, anxiolysis and regional anesthesia Universal protocol:    Procedure explained and questions answered to patient or proxy's satisfaction: yes     Relevant documents present and verified: yes     Test results available and properly labeled: yes     Imaging studies available: yes     Required blood products, implants, devices, and special equipment available: yes     Site/side marked: yes     Immediately prior to procedure a time out was called: yes     Patient identity confirmation method:  Verbally with patient Indications:    Procedure necessitating sedation performed by:  Physician performing sedation Pre-sedation assessment:    Time since last food or drink:  4   ASA classification: class 1 - normal, healthy patient     Neck mobility: normal     Mouth opening:  3 or more finger widths   Thyromental distance:  4 finger widths   Mallampati score:  I - soft palate, uvula, fauces, pillars visible   Pre-sedation assessments completed and reviewed: airway patency, cardiovascular function, hydration status, mental status, nausea/vomiting, pain level, respiratory function and temperature   Immediate  pre-procedure details:    Reassessment: Patient reassessed immediately prior to procedure     Reviewed: vital signs, relevant labs/tests and NPO status     Verified: bag valve mask available, emergency equipment available, intubation equipment available, IV patency confirmed, oxygen available and suction available   Procedure details (see MAR for exact dosages):    Preoxygenation:  Nasal cannula   Sedation:  Etomidate   Intended level of sedation: deep   Intra-procedure monitoring:  Blood pressure monitoring, cardiac monitor, continuous pulse oximetry, frequent LOC assessments, frequent vital sign checks and continuous capnometry   Intra-procedure events: none     Total Provider sedation time (minutes):  15 Post-procedure details:    Attendance: Constant attendance by certified staff until patient recovered     Recovery: Patient returned to pre-procedure baseline     Post-sedation assessments completed and reviewed: airway patency, cardiovascular function, hydration status, mental status, nausea/vomiting, pain level, respiratory function and temperature     Patient is stable for discharge or admission: yes     Patient tolerance:  Tolerated well, no immediate complications .Cardioversion  Date/Time: 10/28/2019 4:12 PM Performed by: Gareth Morgan, MD Authorized by: Gareth Morgan, MD   Consent:    Consent obtained:  Verbal   Consent given by:  Patient   Risks discussed:  Cutaneous burn, death, induced arrhythmia and pain   Alternatives discussed:  Rate-control medication, anti-coagulation medication and delayed treatment Pre-procedure details:    Cardioversion basis:  Elective   Rhythm:  Atrial fibrillation Patient sedated: Yes. Refer to sedation procedure documentation for details of sedation.  Attempt one:    Cardioversion mode:  Synchronous   Shock (Joules):  120   Shock outcome:  Conversion to normal sinus rhythm Post-procedure details:    Patient status:  Awake   Patient  tolerance of procedure:  Tolerated well, no immediate complications .Critical Care Performed by: Gareth Morgan, MD Authorized by: Gareth Morgan, MD   Critical care provider statement:    Critical care time (minutes):  30   Critical care was time spent personally by me on the following activities:  Discussions with consultants, evaluation of patient's response to treatment, examination of patient, ordering and performing treatments and interventions, ordering and review of laboratory studies, ordering and review of radiographic studies, pulse oximetry, re-evaluation  of patient's condition, obtaining history from patient or surrogate and review of old charts   (including critical care time)  Medications Ordered in ED Medications  etomidate (AMIDATE) injection 8 mg (8 mg Intravenous Not Given 10/28/19 1532)  sodium chloride 0.9 % bolus 1,000 mL (1,000 mLs Intravenous New Bag/Given 10/28/19 1358)  etomidate (AMIDATE) injection (8 mg Intravenous Given 10/28/19 1525)    ED Course  I have reviewed the triage vital signs and the nursing notes.  Pertinent labs & imaging results that were available during my care of the patient were reviewed by me and considered in my medical decision making (see chart for details).    MDM Rules/Calculators/A&P                          54 year old male with a history of paroxysmal atrial fibrillation, not on anticoagulation with last episode 3 years ago, hyperlipidemia, GERD, presents with concern for symptoms of atrial fibrillation and palpitations beginning last night.  Episode likely triggered by dehydration yesterday.  After taking diltiazem at home, his heart rate is ranging from the 100s to up to the 140s.  Discussed options for treatment, including rate control medications or electrical cardioversion.  Given he has had symptoms for less than 24 hours, and has infrequent episodes of atrial fibrillation with last episode 3 years ago, and is a reliable  historian regarding when his symptoms of atrial fibrillation began, discussed he is a candidate for electrical cardioversion.  Discussed risks and benefits of the procedure, and proceeded with electrical cardioversion under etomidate sedation.  He was successfully cardioverted to normal sinus rhythm and symptoms have resolved.   His CHADSVASc2 score is 0, with short episode of atrial fibrillation and discussed with patient and Dr. Raul Del and agree it is reasonable to hold on anticoagulation at this time.  He will follow up with Dr. Dulce Sellar as an outpatient.   Final Clinical Impression(s) / ED Diagnoses Final diagnoses:  Paroxysmal atrial fibrillation [facility])    Rx / DC Orders ED Discharge Orders    None       Alvira Monday, MD 10/28/19 1616    Alvira Monday, MD 11/09/19 2224

## 2019-10-28 NOTE — ED Notes (Signed)
Pre procedure heart rhythm atrial fib

## 2019-11-21 NOTE — Progress Notes (Signed)
Cardiology Office Note:    Date:  11/22/2019   ID:  Joshua Chavez, DOB Dec 28, 1965, MRN [id number]  PCP:  Paulina Fusi, MD  Cardiologist:  Norman Herrlich, MD    Referring MD: Paulina Fusi, MD    ASSESSMENT:    1. PAF (paroxysmal atrial fibrillation) (HCC)   2. Agatston coronary artery calcium score between 100 and 199   3. Hyperlipidemia, unspecified hyperlipidemia type    PLAN:    In order of problems listed above:  1. Recurrent initiate antiarrhythmic drug flecainide follow-up office visit 1 week and if he fails this he be a good candidate for EP catheter ablation at this time I would not initiate anticoagulation and to continue beta-blocker 2. Continue statin   Next appointment: 1 week   Medication Adjustments/Labs and Tests Ordered: Current medicines are reviewed at length with the patient today.  Concerns regarding medicines are outlined above.  No orders of the defined types were placed in this encounter.  No orders of the defined types were placed in this encounter.   Chief Complaint  Patient presents with  . Follow-up  . Atrial Fibrillation    Recent ED visit 10/28/2019 with DC cardioversion    History of Present Illness:    Joshua Chavez is a 54 y.o. male with a hx of  paroxysmal atrial fibrillation and severe hyperlipidemia last seen 02/09/2019.  To assist in decision regarding lipid-lowering treatment he underwent a coronary calcium score which was quite elevated at 166 calcification of both the left anterior descending and right coronary arteries and was 89th percentile for age and sex matched control.  He was initiated on high intensity statin.  He was seen at [facility] ED 10/28/2019 with his third episode of atrial fibrillation and was DC cardioverted to sinus rhythm.  His stroke risk is quite low CHA2DS2-VASc score of 0 and after discussion with electrophysiology decided not to place him on anticoagulation.  An echocardiogram done  [facility] 11/18/2016 showed normal left ventricular size and function normal biatrial enlargement and no significant valvular abnormality.  Recent labs in the emergency room department 10/28/2019 show normal CMP and CBC and I independently reviewed the ED EKG which showed atrial fibrillation rate of 99 bpm otherwise normal.  Compliance with diet, lifestyle and medications: Yes  I reviewed to the strips on his iPhone documenting atrial fibrillation prior to cardioversion.  No recurrence since.  We discussed options referral to EP ablation versus antiarrhythmic drug therapy and will initiate flecainide with a follow-up office visit in 1 week for EKG.  At this time I would not initiate anticoagulation.  There is no obvious precipitant except for a great deal of marital and work stress.  No chest pain syncope TIA Past Medical History:  Diagnosis Date  . Anxiety   . Arrhythmia   . Arthritis   . Atrial fibrillation (HCC)   . GERD (gastroesophageal reflux disease)   . Herpes simplex   . Hyperlipidemia   . IBS (irritable bowel syndrome)     Past Surgical History:  Procedure Laterality Date  . COLONOSCOPY  04/08/2016   Colonic polyp status post polypectomy. Small internal hemorrhoids.   . ENDOSCOPIC PLANTAR FASCIOTOMY Left   . ESOPHAGOGASTRODUODENOSCOPY  04/08/2016   Erosive esophagitis. Small hiatal hernia.   Marland Kitchen HERNIA REPAIR    . HERNIA REPAIR Bilateral   . shouler Left    shoulder  . WISDOM TOOTH EXTRACTION Bilateral     Current Medications: Current Meds  Medication Sig  . ANDROGEL PUMP 20.25 MG/ACT (1.62%) GEL   . Ascorbic Acid (VITAMIN C) 1000 MG tablet Take 1,000 mg by mouth daily.  Marland Kitchen b complex vitamins capsule Take 1 capsule by mouth every other day.   Marland Kitchen co-enzyme Q-10 30 MG capsule Take 30 mg by mouth 3 (three) times daily.  . Magnesium 250 MG TABS Take 250 mg by mouth daily.  . Melatonin 1 MG TABS Take 1 mg by mouth at bedtime as needed.  . metoprolol succinate  (TOPROL-XL) 25 MG 24 hr tablet Take 0.5 tablets (12.5 mg total) by mouth daily.  Marland Kitchen omeprazole (PRILOSEC) 40 MG capsule Take 40 mg by mouth daily.  . valACYclovir (VALTREX) 1000 MG tablet Take 1,000 mg by mouth daily as needed.   . vitamin E 180 MG (400 UNITS) capsule Take 400 Units by mouth every other day.     Allergies:   Patient has no known allergies.   Social History   Socioeconomic History  . Marital status: Married    Spouse name: Not on file  . Number of children: Not on file  . Years of education: Not on file  . Highest education level: Not on file  Occupational History  . Not on file  Tobacco Use  . Smoking status: Former Smoker    Types: Cigarettes    Quit date: 2005    Years since quitting: 16.5  . Smokeless tobacco: Never Used  Vaping Use  . Vaping Use: Never used  Substance and Sexual Activity  . Alcohol use: Not Currently  . Drug use: No  . Sexual activity: Not on file  Other Topics Concern  . Not on file  Social History Narrative  . Not on file   Social Determinants of Health   Financial Resource Strain:   . Difficulty of Paying Living Expenses:   Food Insecurity:   . Worried About Programme researcher, broadcasting/film/video in the Last Year:   . Barista in the Last Year:   Transportation Needs:   . Freight forwarder (Medical):   Marland Kitchen Lack of Transportation (Non-Medical):   Physical Activity:   . Days of Exercise per Week:   . Minutes of Exercise per Session:   Stress:   . Feeling of Stress :   Social Connections:   . Frequency of Communication with Friends and Family:   . Frequency of Social Gatherings with Friends and Family:   . Attends Religious Services:   . Active Member of Clubs or Organizations:   . Attends Banker Meetings:   Marland Kitchen Marital Status:      Family History: The patient's family history includes Colon cancer in his maternal grandfather; Hypertension in his mother. ROS:   Please see the history of present illness.    All  other systems reviewed and are negative.  EKGs/Labs/Other Studies Reviewed:    The following studies were reviewed today  Recent Labs: 10/28/2019: ALT 39; BUN 14; Creatinine, Ser 0.79; Hemoglobin 15.5; Platelets 187; Potassium 4.0; Sodium 136  Recent Lipid Panel No results found for: CHOL, TRIG, HDL, CHOLHDL, VLDL, LDLCALC, LDLDIRECT  Physical Exam:    VS:  BP 110/80   Pulse 72   Ht 6\' 2"  (1.88 m)   Wt 184 lb (83.5 kg)   SpO2 98%   BMI 23.62 kg/m     Wt Readings from Last 3 Encounters:  11/22/19 184 lb (83.5 kg)  10/28/19 180 lb (81.6 kg)  02/09/19 187 lb 0.6  oz (84.8 kg)     GEN:  Well nourished, well developed in no acute distress HEENT: Normal NECK: No JVD; No carotid bruits LYMPHATICS: No lymphadenopathy CARDIAC: RRR, no murmurs, rubs, gallops RESPIRATORY:  Clear to auscultation without rales, wheezing or rhonchi  ABDOMEN: Soft, non-tender, non-distended MUSCULOSKELETAL:  No edema; No deformity  SKIN: Warm and dry NEUROLOGIC:  Alert and oriented x 3 PSYCHIATRIC:  Normal affect    Signed, Norman Herrlich, MD  11/22/2019 9:21 AM    [facility]

## 2019-11-22 ENCOUNTER — Encounter: Payer: Self-pay | Admitting: Cardiology

## 2019-11-22 ENCOUNTER — Other Ambulatory Visit: Payer: Self-pay

## 2019-11-22 ENCOUNTER — Ambulatory Visit: Payer: 59 | Admitting: Cardiology

## 2019-11-22 VITALS — BP 110/80 | HR 72 | Ht 74.0 in | Wt 184.0 lb

## 2019-11-22 DIAGNOSIS — I480 Paroxysmal atrial fibrillation: Secondary | ICD-10-CM

## 2019-11-22 DIAGNOSIS — R931 Abnormal findings on diagnostic imaging of heart and coronary circulation: Secondary | ICD-10-CM

## 2019-11-22 DIAGNOSIS — E785 Hyperlipidemia, unspecified: Secondary | ICD-10-CM

## 2019-11-22 MED ORDER — FLECAINIDE ACETATE 50 MG PO TABS
50.0000 mg | ORAL_TABLET | Freq: Two times a day (BID) | ORAL | 3 refills | Status: DC
Start: 2019-11-22 — End: 2020-11-05

## 2019-11-22 NOTE — Patient Instructions (Addendum)
Medication Instructions:  Your physician has recommended you make the following change in your medication:  START: Flecainide 50 mg take one tablet by mouth twice daily.  *If you need a refill on your cardiac medications before your next appointment, please call your pharmacy*   Lab Work: None If you have labs (blood work) drawn today and your tests are completely normal, you will receive your results only by: Marland Kitchen MyChart Message (if you have MyChart) OR . A paper copy in the mail If you have any lab test that is abnormal or we need to change your treatment, we will call you to review the results.   Testing/Procedures: None   Follow-Up: At [facility], you and your health needs are our priority.  As part of our continuing mission to provide you with exceptional heart care, we have created designated Provider Care Teams.  These Care Teams include your primary Cardiologist (physician) and Advanced Practice Providers (APPs -  Physician Assistants and Nurse Practitioners) who all work together to provide you with the care you need, when you need it.  We recommend signing up for the patient portal called "MyChart".  Sign up information is provided on this After Visit Summary.  MyChart is used to connect with patients for Virtual Visits (Telemedicine).  Patients are able to view lab/test results, encounter notes, upcoming appointments, etc.  Non-urgent messages can be sent to your provider as well.   To learn more about what you can do with MyChart, go to [email or web address].    Your next appointment:   3 month  The format for your next appointment:   In Person  Provider:   Norman Herrlich, MD    Other Instructions

## 2019-12-08 ENCOUNTER — Ambulatory Visit: Payer: 59 | Admitting: Cardiology

## 2019-12-08 ENCOUNTER — Encounter: Payer: Self-pay | Admitting: Cardiology

## 2019-12-08 ENCOUNTER — Other Ambulatory Visit: Payer: Self-pay

## 2019-12-08 VITALS — BP 106/82 | HR 70 | Ht 74.0 in | Wt 183.0 lb

## 2019-12-08 DIAGNOSIS — I480 Paroxysmal atrial fibrillation: Secondary | ICD-10-CM

## 2019-12-08 NOTE — Patient Instructions (Signed)
Medication Instructions:  Your physician recommends that you continue on your current medications as directed. Please refer to the Current Medication list given to you today.  *If you need a refill on your cardiac medications before your next appointment, please call your pharmacy*   Lab Work: None.  If you have labs (blood work) drawn today and your tests are completely normal, you will receive your results only by: Marland Kitchen MyChart Message (if you have MyChart) OR . A paper copy in the mail If you have any lab test that is abnormal or we need to change your treatment, we will call you to review the results.   Testing/Procedures: None.    Follow-Up: At [facility], you and your health needs are our priority.  As part of our continuing mission to provide you with exceptional heart care, we have created designated Provider Care Teams.  These Care Teams include your primary Cardiologist (physician) and Advanced Practice Providers (APPs -  Physician Assistants and Nurse Practitioners) who all work together to provide you with the care you need, when you need it.  We recommend signing up for the patient portal called "MyChart".  Sign up information is provided on this After Visit Summary.  MyChart is used to connect with patients for Virtual Visits (Telemedicine).  Patients are able to view lab/test results, encounter notes, upcoming appointments, etc.  Non-urgent messages can be sent to your provider as well.   To learn more about what you can do with MyChart, go to [email or web address].    Your next appointment:    Follow up with Dr.Munley as scheduled.

## 2019-12-08 NOTE — Progress Notes (Signed)
Cardiology Office Note:    Date:  12/08/2019   ID:  Joshua Chavez, DOB [birth year], MRN [id number]  PCP:  Paulina Fusi, MD  Cardiologist:  No primary care provider on file.  Electrophysiologist:  None   Referring MD: Paulina Fusi, MD   " I am doing"  History of Present Illness:    Joshua Chavez is a 54 y.o. male with a hx of   Paroxysmal atrial fibrillation restarted on flecainide about a week ago, hyperlipidemia, and significant elevated coronary calcium score in his left descending arteries and right coronary arteries.  The patient saw Dr. Dulce Sellar on November 22, 2019 at that time flecainide was restarted.  He is here today for follow-up visit.   Past Medical History:  Diagnosis Date  . Anxiety   . Arrhythmia   . Arthritis   . Atrial fibrillation (HCC)   . GERD (gastroesophageal reflux disease)   . Herpes simplex   . Hyperlipidemia   . IBS (irritable bowel syndrome)     Past Surgical History:  Procedure Laterality Date  . COLONOSCOPY  04/08/2016   Colonic polyp status post polypectomy. Small internal hemorrhoids.   . ENDOSCOPIC PLANTAR FASCIOTOMY Left   . ESOPHAGOGASTRODUODENOSCOPY  04/08/2016   Erosive esophagitis. Small hiatal hernia.   Marland Kitchen HERNIA REPAIR    . HERNIA REPAIR Bilateral   . shouler Left    shoulder  . WISDOM TOOTH EXTRACTION Bilateral     Current Medications: Current Meds  Medication Sig  . ANDROGEL PUMP 20.25 MG/ACT (1.62%) GEL   . Ascorbic Acid (VITAMIN C) 1000 MG tablet Take 1,000 mg by mouth daily.  Marland Kitchen b complex vitamins capsule Take 1 capsule by mouth every other day.   Marland Kitchen co-enzyme Q-10 30 MG capsule Take 30 mg by mouth 3 (three) times daily.  . flecainide (TAMBOCOR) 50 MG tablet Take 1 tablet (50 mg total) by mouth 2 (two) times daily.  . Magnesium 250 MG TABS Take 250 mg by mouth daily.  . Melatonin 1 MG TABS Take 1 mg by mouth at bedtime as needed.  . metoprolol succinate (TOPROL-XL) 25 MG 24 hr tablet Take 0.5 tablets (12.5 mg  total) by mouth daily.  Marland Kitchen omeprazole (PRILOSEC) 40 MG capsule Take 40 mg by mouth daily.  . rosuvastatin (CRESTOR) 10 MG tablet Take 1 tablet (10 mg total) by mouth daily.  . valACYclovir (VALTREX) 1000 MG tablet Take 1,000 mg by mouth daily as needed.   . vitamin E 180 MG (400 UNITS) capsule Take 400 Units by mouth every other day.     Allergies:   Patient has no known allergies.   Social History   Socioeconomic History  . Marital status: Married    Spouse name: Not on file  . Number of children: Not on file  . Years of education: Not on file  . Highest education level: Not on file  Occupational History  . Not on file  Tobacco Use  . Smoking status: Former Smoker    Types: Cigarettes    Quit date: 2005    Years since quitting: 16.5  . Smokeless tobacco: Never Used  Vaping Use  . Vaping Use: Never used  Substance and Sexual Activity  . Alcohol use: Not Currently  . Drug use: No  . Sexual activity: Not on file  Other Topics Concern  . Not on file  Social History Narrative  . Not on file   Social Determinants of Health   Financial Resource Strain:   .  Difficulty of Paying Living Expenses:   Food Insecurity:   . Worried About Programme researcher, broadcasting/film/video in the Last Year:   . Barista in the Last Year:   Transportation Needs:   . Freight forwarder (Medical):   Marland Kitchen Lack of Transportation (Non-Medical):   Physical Activity:   . Days of Exercise per Week:   . Minutes of Exercise per Session:   Stress:   . Feeling of Stress :   Social Connections:   . Frequency of Communication with Friends and Family:   . Frequency of Social Gatherings with Friends and Family:   . Attends Religious Services:   . Active Member of Clubs or Organizations:   . Attends Banker Meetings:   Marland Kitchen Marital Status:      Family History: The patient's family history includes Colon cancer in his maternal grandfather; Hypertension in his mother.  ROS:   Review of Systems    Constitution: Negative for decreased appetite, fever and weight gain.  HENT: Negative for congestion, ear discharge, hoarse voice and sore throat.   Eyes: Negative for discharge, redness, vision loss in right eye and visual halos.  Cardiovascular: Negative for chest pain, dyspnea on exertion, leg swelling, orthopnea and palpitations.  Respiratory: Negative for cough, hemoptysis, shortness of breath and snoring.   Endocrine: Negative for heat intolerance and polyphagia.  Hematologic/Lymphatic: Negative for bleeding problem. Does not bruise/bleed easily.  Skin: Negative for flushing, nail changes, rash and suspicious lesions.  Musculoskeletal: Negative for arthritis, joint pain, muscle cramps, myalgias, neck pain and stiffness.  Gastrointestinal: Negative for abdominal pain, bowel incontinence, diarrhea and excessive appetite.  Genitourinary: Negative for decreased libido, genital sores and incomplete emptying.  Neurological: Negative for brief paralysis, focal weakness, headaches and loss of balance.  Psychiatric/Behavioral: Negative for altered mental status, depression and suicidal ideas.  Allergic/Immunologic: Negative for HIV exposure and persistent infections.    EKGs/Labs/Other Studies Reviewed:    The following studies were reviewed today:   EKG:  The ekg ordered today demonstrates sinus rhythm, heart rate 75 bpm.  Recent Labs: 10/28/2019: ALT 39; BUN 14; Creatinine, Ser 0.79; Hemoglobin 15.5; Platelets 187; Potassium 4.0; Sodium 136  Recent Lipid Panel No results found for: CHOL, TRIG, HDL, CHOLHDL, VLDL, LDLCALC, LDLDIRECT  Physical Exam:    VS:  BP 106/82 (BP Location: Right Arm, Patient Position: Sitting, Cuff Size: Normal)   Pulse 70   Ht 6\' 2"  (1.88 m)   Wt 183 lb (83 kg)   SpO2 97%   BMI 23.50 kg/m     Wt Readings from Last 3 Encounters:  12/08/19 183 lb (83 kg)  11/22/19 184 lb (83.5 kg)  10/28/19 180 lb (81.6 kg)     GEN: Well nourished, well developed in  no acute distress HEENT: Normal NECK: No JVD; No carotid bruits LYMPHATICS: No lymphadenopathy CARDIAC: S1S2 noted,RRR, no murmurs, rubs, gallops RESPIRATORY:  Clear to auscultation without rales, wheezing or rhonchi  ABDOMEN: Soft, non-tender, non-distended, +bowel sounds, no guarding. EXTREMITIES: No edema, No cyanosis, no clubbing MUSCULOSKELETAL:  No deformity  SKIN: Warm and dry NEUROLOGIC:  Alert and oriented x 3, non-focal PSYCHIATRIC:  Normal affect, good insight  ASSESSMENT:    1. AF (paroxysmal atrial fibrillation) (HCC)    PLAN:    The patient is in sinus rhythm today thankfully, he will continue his flecainide 50 mg twice daily.  With his metoprolol succinate 12.5 mg daily.  The patient has some questions about his medications and  was able to answer these questions to his satisfaction.  He will follow up with Dr. Dulce Sellar as scheduled in 2 months.  The patient is in agreement with the above plan. The patient left the office in stable condition.  The patient will follow up in 2 months with Dr. Dulce Sellar   Medication Adjustments/Labs and Tests Ordered: Current medicines are reviewed at length with the patient today.  Concerns regarding medicines are outlined above.  Orders Placed This Encounter  Procedures  . EKG 12-Lead   No orders of the defined types were placed in this encounter.   Patient Instructions  Medication Instructions:  Your physician recommends that you continue on your current medications as directed. Please refer to the Current Medication list given to you today.  *If you need a refill on your cardiac medications before your next appointment, please call your pharmacy*   Lab Work: None.  If you have labs (blood work) drawn today and your tests are completely normal, you will receive your results only by: Marland Kitchen MyChart Message (if you have MyChart) OR . A paper copy in the mail If you have any lab test that is abnormal or we need to change your treatment,  we will call you to review the results.   Testing/Procedures: None.    Follow-Up: At [facility], you and your health needs are our priority.  As part of our continuing mission to provide you with exceptional heart care, we have created designated Provider Care Teams.  These Care Teams include your primary Cardiologist (physician) and Advanced Practice Providers (APPs -  Physician Assistants and Nurse Practitioners) who all work together to provide you with the care you need, when you need it.  We recommend signing up for the patient portal called "MyChart".  Sign up information is provided on this After Visit Summary.  MyChart is used to connect with patients for Virtual Visits (Telemedicine).  Patients are able to view lab/test results, encounter notes, upcoming appointments, etc.  Non-urgent messages can be sent to your provider as well.   To learn more about what you can do with MyChart, go to [email or web address].    Your next appointment:    Follow up with Dr.Munley as scheduled.     Adopting a Healthy Lifestyle.  Know what a healthy weight is for you (roughly BMI <25) and aim to maintain this   Aim for 7+ servings of fruits and vegetables daily   65-80+ fluid ounces of water or unsweet tea for healthy kidneys   Limit to max 1 drink of alcohol per day; avoid smoking/tobacco   Limit animal fats in diet for cholesterol and heart health - choose grass fed whenever available   Avoid highly processed foods, and foods high in saturated/trans fats   Aim for low stress - take time to unwind and care for your mental health   Aim for 150 min of moderate intensity exercise weekly for heart health, and weights twice weekly for bone health   Aim for 7-9 hours of sleep daily   When it comes to diets, agreement about the perfect plan isnt easy to find, even among the experts. Experts at the [facility] developed an idea known as the Healthy Eating Plate. Just  imagine a plate divided into logical, healthy portions.   The emphasis is on diet quality:   Load up on vegetables and fruits - one-half of your plate: Aim for color and variety, and remember that potatoes dont count.  Go for whole grains - one-quarter of your plate: Whole wheat, barley, wheat berries, quinoa, oats, brown rice, and foods made with them. If you want pasta, go with whole wheat pasta.   Protein power - one-quarter of your plate: Fish, chicken, beans, and nuts are all healthy, versatile protein sources. Limit red meat.   The diet, however, does go beyond the plate, offering a few other suggestions.   Use healthy plant oils, such as olive, canola, soy, corn, sunflower and peanut. Check the labels, and avoid partially hydrogenated oil, which have unhealthy trans fats.   If youre thirsty, drink water. Coffee and tea are good in moderation, but skip sugary drinks and limit milk and dairy products to one or two daily servings.   The type of carbohydrate in the diet is more important than the amount. Some sources of carbohydrates, such as vegetables, fruits, whole grains, and beans-are healthier than others.   Finally, stay active  Signed, Thomasene RippleKardie Arianny Pun, DO  12/08/2019 2:27 PM    [facility]

## 2020-01-13 ENCOUNTER — Other Ambulatory Visit: Payer: Self-pay | Admitting: Cardiology

## 2020-01-13 DIAGNOSIS — I480 Paroxysmal atrial fibrillation: Secondary | ICD-10-CM

## 2020-01-17 IMAGING — CT CT HEART SCORING
2 series · 16 of 20 positions shown, 18 images · non-contrast
Comparison: None.
COMPARISON: None.

Addendum:
EXAM:
OVER-READ INTERPRETATION  CT CHEST

The following report is an over-read performed by radiologist Dr.
Brennan Aujla [REDACTED] on 03/22/2019. This
over-read does not include interpretation of cardiac or coronary
anatomy or pathology. The coronary calcium score interpretation by
the cardiologist is attached.
CLINICAL DATA: Risk stratification
Coronary Calcium Score
TECHNIQUE: The patient was scanned on a Siemens Force scanner. Axial
non-contrast 3 mm slices were carried out through the heart. The
data set was analyzed on a dedicated work station and scored using
the Agatson method.

[Series 2: casc 3.0 i36f 2 bestdiast 68 % · axial · 0.36mm/px · z∈[-270,-156]mm · 8 of 50 slices shown, 10 images]
[im 6/50  vessel]
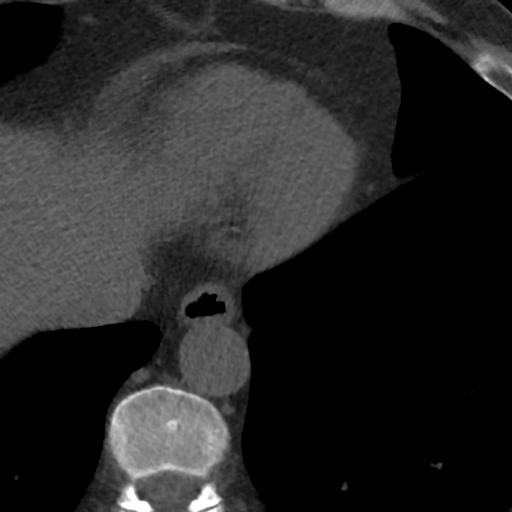
[im 6/50  lung]
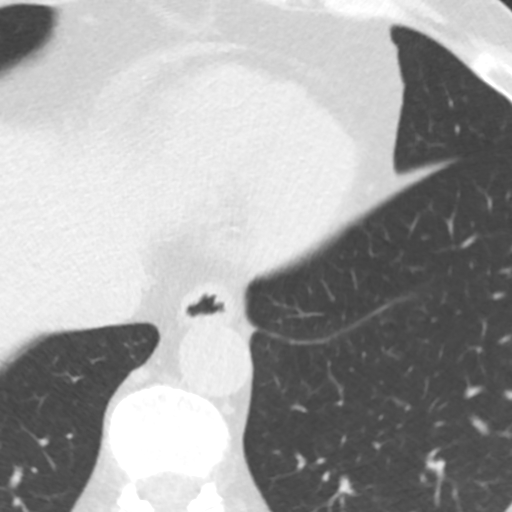
[im 11/50  vessel]
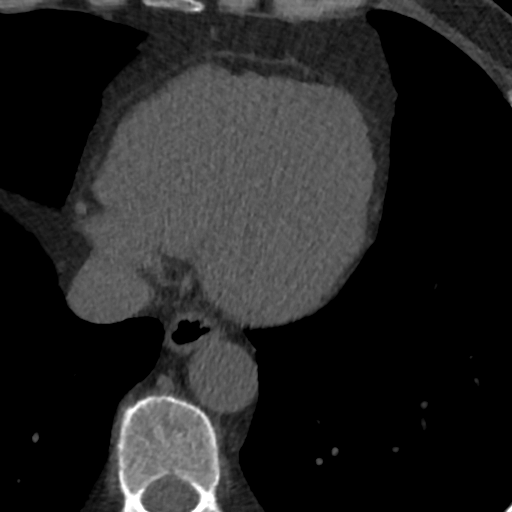
[im 17/50  vessel]
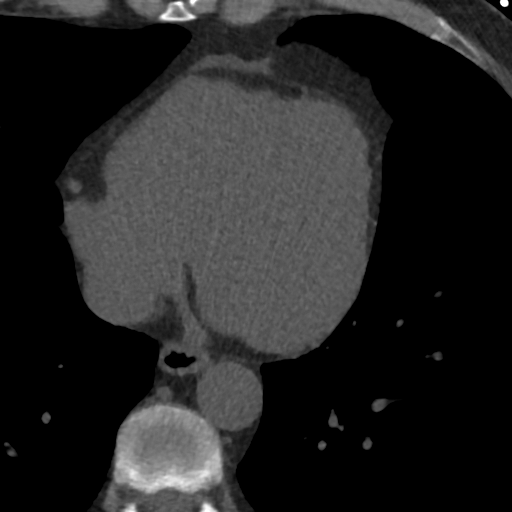
[im 22/50  vessel]
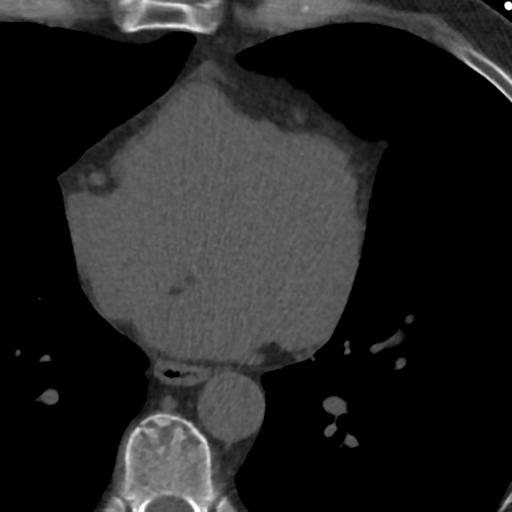
[im 28/50  vessel]
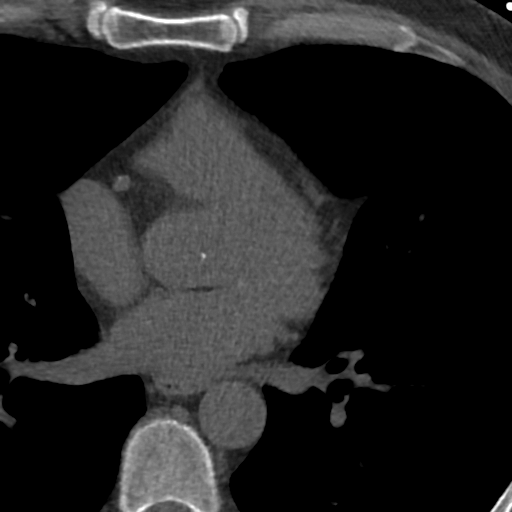
[im 28/50  lung]
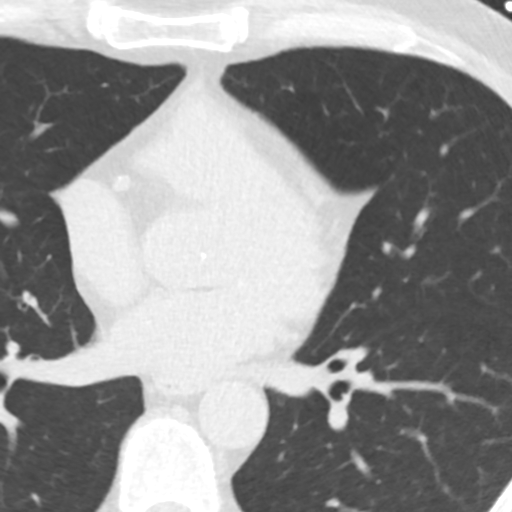
[im 33/50  vessel]
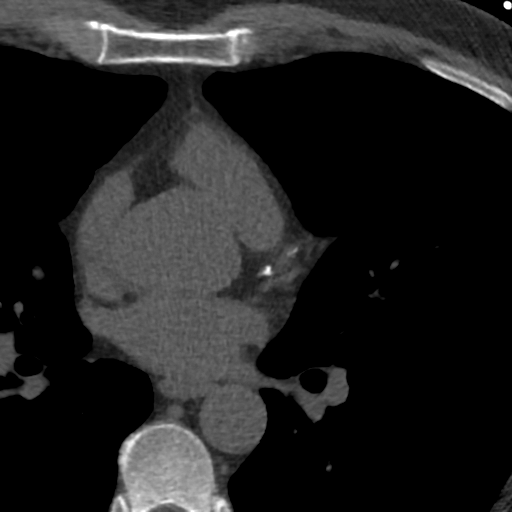
[im 39/50  vessel]
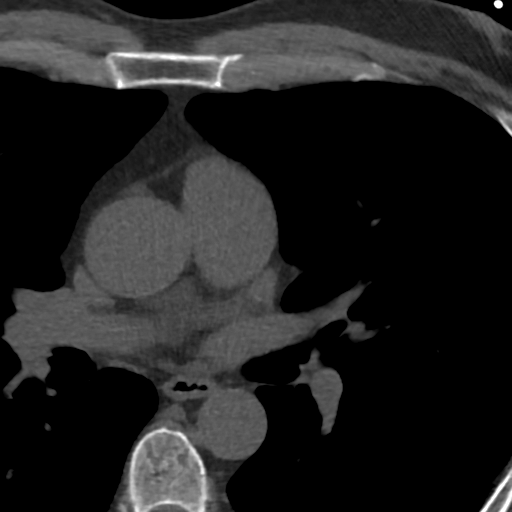
[im 44/50  vessel]
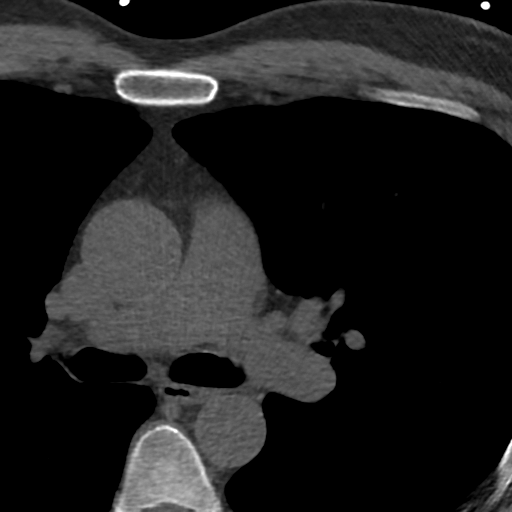

[Series 4: lung st 68 % · axial · 0.71mm/px · z∈[-270,-156]mm · 8 of 50 slices shown]
[im 6/50  lung]
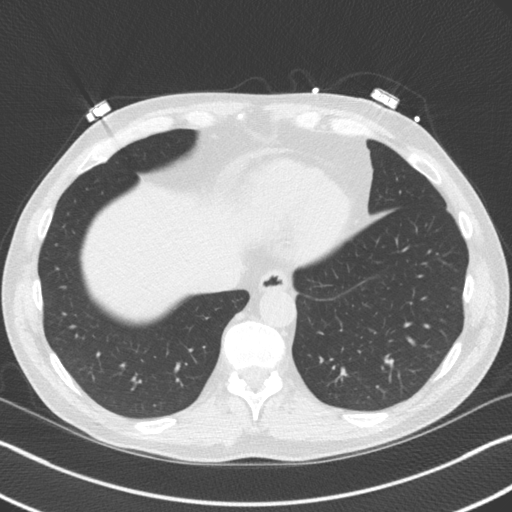
[im 11/50  lung]
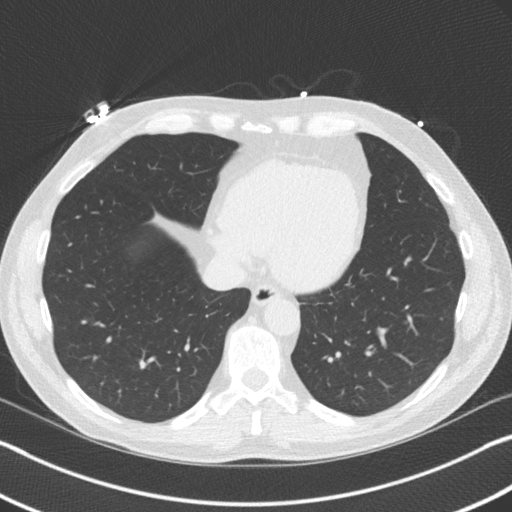
[im 17/50  lung]
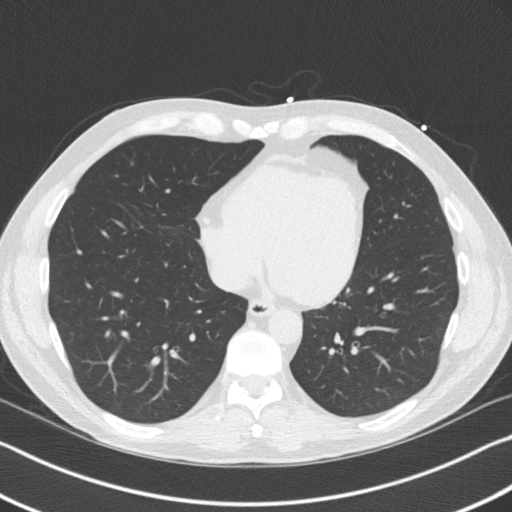
[im 22/50  lung]
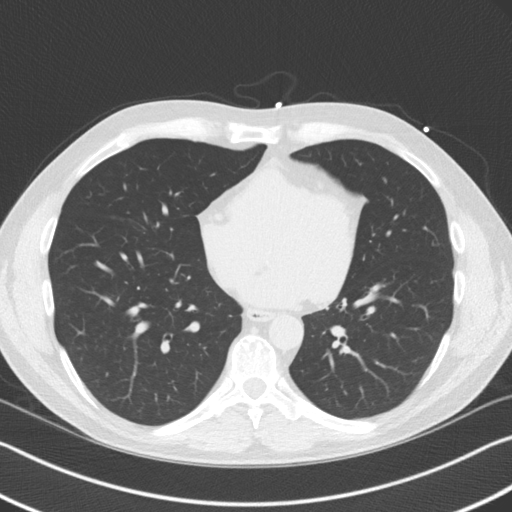
[im 28/50  lung]
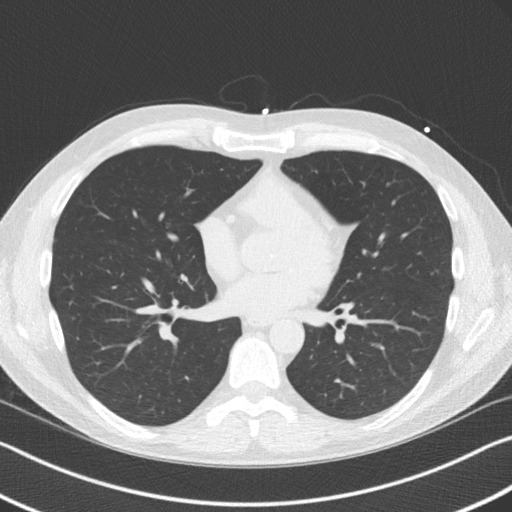
[im 33/50  lung]
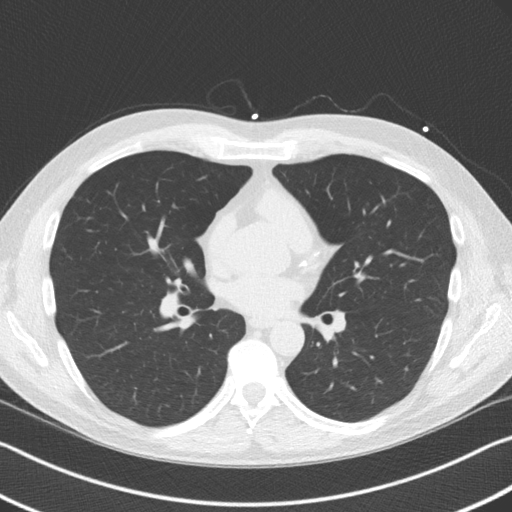
[im 39/50  lung]
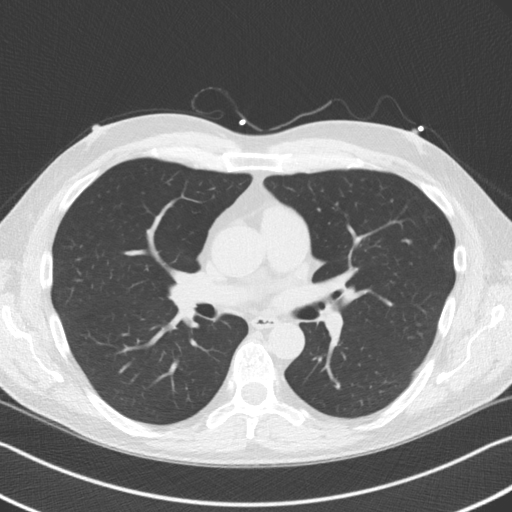
[im 44/50  lung]
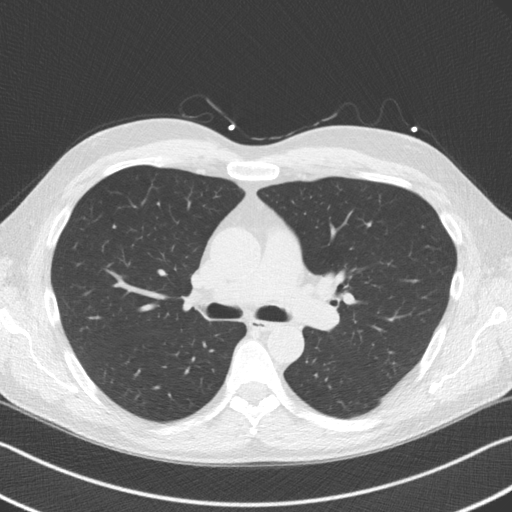

[16 of 20 positions shown; findings below may reference images not displayed]

FINDINGS: Limited view of the lung parenchyma demonstrates 4 mm nodule in the
RIGHT middle lobe (image 19/series 3). Airways are normal.

Limited view of the mediastinum demonstrates no adenopathy.
Esophagus normal.

Limited view of the upper abdomen unremarkable.

Limited view of the skeleton and chest wall is unremarkable.
IMPRESSION: 1. Small RIGHT middle lobe pulmonary nodule. No follow-up needed if
patient is low-risk. Non-contrast chest CT can be considered in 12
months if patient is high-risk. This recommendation follows the
consensus statement: Guidelines for Management of Incidental
Pulmonary Nodules Detected on CT Images: From the [HOSPITAL]

These results will be called to the ordering clinician or
representative by the Radiologist Assistant, and communication
documented in the PACS or zVision Dashboard.
FINDINGS: Non-cardiac: See separate report from [REDACTED].

Ascending Aorta: Normal Caliber.  Mildly calcified AV.

Pericardium: Normal

Coronary arteries: Normal coronary origins. Calcifications of the
LAD and RCA.
IMPRESSION: Coronary calcium score of 166. This was 89th percentile for age and
sex matched control.

Uriah Germany

*** End of Addendum ***
EXAM:
OVER-READ INTERPRETATION  CT CHEST

The following report is an over-read performed by radiologist Dr.
Brennan Aujla [REDACTED] on 03/22/2019. This
over-read does not include interpretation of cardiac or coronary
anatomy or pathology. The coronary calcium score interpretation by
the cardiologist is attached.
FINDINGS: Limited view of the lung parenchyma demonstrates 4 mm nodule in the
RIGHT middle lobe (image 19/series 3). Airways are normal.

Limited view of the mediastinum demonstrates no adenopathy.
Esophagus normal.

Limited view of the upper abdomen unremarkable.

Limited view of the skeleton and chest wall is unremarkable.
IMPRESSION: 1. Small RIGHT middle lobe pulmonary nodule. No follow-up needed if
patient is low-risk. Non-contrast chest CT can be considered in 12
months if patient is high-risk. This recommendation follows the
consensus statement: Guidelines for Management of Incidental
Pulmonary Nodules Detected on CT Images: From the [HOSPITAL]

These results will be called to the ordering clinician or
representative by the Radiologist Assistant, and communication
documented in the PACS or zVision Dashboard.

## 2020-02-20 DIAGNOSIS — I4891 Unspecified atrial fibrillation: Secondary | ICD-10-CM | POA: Insufficient documentation

## 2020-02-20 DIAGNOSIS — E785 Hyperlipidemia, unspecified: Secondary | ICD-10-CM | POA: Insufficient documentation

## 2020-02-20 DIAGNOSIS — F419 Anxiety disorder, unspecified: Secondary | ICD-10-CM | POA: Insufficient documentation

## 2020-02-20 DIAGNOSIS — I499 Cardiac arrhythmia, unspecified: Secondary | ICD-10-CM | POA: Insufficient documentation

## 2020-02-20 DIAGNOSIS — K589 Irritable bowel syndrome without diarrhea: Secondary | ICD-10-CM | POA: Insufficient documentation

## 2020-02-20 DIAGNOSIS — M1990 Unspecified osteoarthritis, unspecified site: Secondary | ICD-10-CM | POA: Insufficient documentation

## 2020-02-20 DIAGNOSIS — K219 Gastro-esophageal reflux disease without esophagitis: Secondary | ICD-10-CM | POA: Insufficient documentation

## 2020-02-20 DIAGNOSIS — B009 Herpesviral infection, unspecified: Secondary | ICD-10-CM | POA: Insufficient documentation

## 2020-02-21 NOTE — Progress Notes (Signed)
Cardiology Office Note:    Date:  02/22/2020   ID:  Joshua Chavez, DOB Feb 01, 1966, MRN [id number]  PCP:  Paulina Fusi, MD  Cardiologist:  Norman Herrlich, MD    Referring MD: Paulina Fusi, MD    ASSESSMENT:    1. AF (paroxysmal atrial fibrillation) (HCC)   2. High risk medication use   3. Agatston coronary artery calcium score between 100 and 199   4. Hyperlipidemia, unspecified hyperlipidemia type    PLAN:    In order of problems listed above:  1. He has done well maintaining sinus rhythm on low-dose flecainide continue the same.  Inquires about stopping the beta-blocker and I reviewed with him the necessity of rate slowing medication if he were to develop slow atrial flutter with one-to-one conduction continue metoprolol. 2. Stable no evidence of toxicity when seen by EKG dose is low and I do not think he needs a blood level 3. He is taking high intensity statin having no anginal symptoms normal EKG and not unreasonable to defer any ischemia evaluation. 4. Continue his statin he has arrangements for wellness exam with his PCP and will have a lipid profile   Next appointment: 1 year   Medication Adjustments/Labs and Tests Ordered: Current medicines are reviewed at length with the patient today.  Concerns regarding medicines are outlined above.  No orders of the defined types were placed in this encounter.  No orders of the defined types were placed in this encounter.   Chief Complaint  Patient presents with  . Follow-up  . Coronary Artery Disease    History of Present Illness:    Joshua Chavez is a 54 y.o. male with a hx of paroxysmal atrial fibrillation on flecainide hyperlipidemia elevated coronary artery calcium score assisting in decision making to accept a high intensity statin with severe dyslipidemia.  He was last seen 12/08/2019.  He was cardioverted to sinus rhythm 10/28/2019 an echocardiogram at [facility] 11/18/2016 showed normal left  ventricular size and function.  Cardiac calcium score 03/22/2019 was 166, 89th percentile for age and sex matched control.  He also had a small right middle lobe pulmonary nodule. Compliance with diet, lifestyle and medications: Yes  He is aware of the need for follow-up CT of the chest will make arrangements order has been placed. Tolerates a statin without muscle pain or weakness He has had no recurrent atrial fibrillation or palpitation and no side effects from flecainide or his beta-blocker He is a vigorous man exercise regularly has no exertional chest pain or shortness of breath we discussed an ischemia evaluation at this time he prefers not to do it and I think this is a reasonable choice Past Medical History:  Diagnosis Date  . AF (paroxysmal atrial fibrillation) (HCC) 11/11/2016   CHADS2vasc=0  . Anxiety   . Arrhythmia   . Arthritis   . Atrial fibrillation (HCC)   . GERD (gastroesophageal reflux disease)   . Herpes simplex   . Hyperlipidemia   . IBS (irritable bowel syndrome)     Past Surgical History:  Procedure Laterality Date  . COLONOSCOPY  04/08/2016   Colonic polyp status post polypectomy. Small internal hemorrhoids.   . ENDOSCOPIC PLANTAR FASCIOTOMY Left   . ESOPHAGOGASTRODUODENOSCOPY  04/08/2016   Erosive esophagitis. Small hiatal hernia.   Marland Kitchen HERNIA REPAIR    . HERNIA REPAIR Bilateral   . shouler Left    shoulder  . WISDOM TOOTH EXTRACTION Bilateral     Current Medications: Current Meds  Medication Sig  . ANDROGEL PUMP 20.25 MG/ACT (1.62%) GEL   . b complex vitamins capsule Take 1 capsule by mouth every other day.   Marland Kitchen co-enzyme Q-10 30 MG capsule Take 30 mg by mouth 3 (three) times daily.  . flecainide (TAMBOCOR) 50 MG tablet Take 1 tablet (50 mg total) by mouth 2 (two) times daily.  . Magnesium 250 MG TABS Take 250 mg by mouth daily.  . Melatonin 1 MG TABS Take 1 mg by mouth at bedtime as needed.  . metoprolol succinate (TOPROL-XL) 25 MG 24 hr tablet TAKE  1/2 TABLET (12.5MG     TOTAL) DAILY  . omeprazole (PRILOSEC) 40 MG capsule Take 40 mg by mouth daily.  . rosuvastatin (CRESTOR) 10 MG tablet TAKE 1 TABLET DAILY  . valACYclovir (VALTREX) 1000 MG tablet Take 1,000 mg by mouth daily as needed.   . vitamin E 180 MG (400 UNITS) capsule Take 400 Units by mouth every other day.     Allergies:   Cinnamon   Social History   Socioeconomic History  . Marital status: Married    Spouse name: Not on file  . Number of children: Not on file  . Years of education: Not on file  . Highest education level: Not on file  Occupational History  . Not on file  Tobacco Use  . Smoking status: Former Smoker    Types: Cigarettes    Quit date: 2005    Years since quitting: 16.7  . Smokeless tobacco: Never Used  Vaping Use  . Vaping Use: Never used  Substance and Sexual Activity  . Alcohol use: Not Currently  . Drug use: No  . Sexual activity: Not on file  Other Topics Concern  . Not on file  Social History Narrative  . Not on file   Social Determinants of Health   Financial Resource Strain:   . Difficulty of Paying Living Expenses: Not on file  Food Insecurity:   . Worried About Programme researcher, broadcasting/film/video in the Last Year: Not on file  . Ran Out of Food in the Last Year: Not on file  Transportation Needs:   . Lack of Transportation (Medical): Not on file  . Lack of Transportation (Non-Medical): Not on file  Physical Activity:   . Days of Exercise per Week: Not on file  . Minutes of Exercise per Session: Not on file  Stress:   . Feeling of Stress : Not on file  Social Connections:   . Frequency of Communication with Friends and Family: Not on file  . Frequency of Social Gatherings with Friends and Family: Not on file  . Attends Religious Services: Not on file  . Active Member of Clubs or Organizations: Not on file  . Attends Banker Meetings: Not on file  . Marital Status: Not on file     Family History: The patient's family  history includes Colon cancer in his maternal grandfather; Hypertension in his mother. ROS:   Please see the history of present illness.    All other systems reviewed and are negative.  EKGs/Labs/Other Studies Reviewed:    The following studies were reviewed today:  EKG:  EKG ordered today and personally reviewed.  The ekg ordered today demonstrates sinus rhythm normal no evidence of 1C toxicity  Recent Labs: 10/28/2019: ALT 39; BUN 14; Creatinine, Ser 0.79; Hemoglobin 15.5; Platelets 187; Potassium 4.0; Sodium 136  Recent Lipid Panel No results found for: CHOL, TRIG, HDL, CHOLHDL, VLDL, LDLCALC, LDLDIRECT  Physical  Exam:    VS:  BP 108/76   Pulse 69   Ht 6\' 2"  (1.88 m)   Wt 182 lb 9.6 oz (82.8 kg)   SpO2 96%   BMI 23.44 kg/m     Wt Readings from Last 3 Encounters:  02/22/20 182 lb 9.6 oz (82.8 kg)  12/08/19 183 lb (83 kg)  11/22/19 184 lb (83.5 kg)     GEN: No xanthoma or xanthelasma well nourished, well developed in no acute distress HEENT: Normal NECK: No JVD; No carotid bruits LYMPHATICS: No lymphadenopathy CARDIAC: RRR, no murmurs, rubs, gallops RESPIRATORY:  Clear to auscultation without rales, wheezing or rhonchi  ABDOMEN: Soft, non-tender, non-distended MUSCULOSKELETAL:  No edema; No deformity  SKIN: Warm and dry NEUROLOGIC:  Alert and oriented x 3 PSYCHIATRIC:  Normal affect    Signed, 11/24/19, MD  02/22/2020 9:30 AM    [facility]

## 2020-02-22 ENCOUNTER — Ambulatory Visit: Payer: 59 | Admitting: Cardiology

## 2020-02-22 ENCOUNTER — Encounter: Payer: Self-pay | Admitting: Cardiology

## 2020-02-22 ENCOUNTER — Other Ambulatory Visit: Payer: Self-pay

## 2020-02-22 VITALS — BP 108/76 | HR 69 | Ht 74.0 in | Wt 182.6 lb

## 2020-02-22 DIAGNOSIS — R931 Abnormal findings on diagnostic imaging of heart and coronary circulation: Secondary | ICD-10-CM

## 2020-02-22 DIAGNOSIS — Z79899 Other long term (current) drug therapy: Secondary | ICD-10-CM

## 2020-02-22 DIAGNOSIS — E785 Hyperlipidemia, unspecified: Secondary | ICD-10-CM

## 2020-02-22 DIAGNOSIS — I480 Paroxysmal atrial fibrillation: Secondary | ICD-10-CM | POA: Diagnosis not present

## 2020-02-22 NOTE — Patient Instructions (Signed)

## 2020-08-09 ENCOUNTER — Other Ambulatory Visit: Payer: Self-pay | Admitting: Cardiology

## 2020-08-09 DIAGNOSIS — I480 Paroxysmal atrial fibrillation: Secondary | ICD-10-CM

## 2020-11-05 ENCOUNTER — Other Ambulatory Visit: Payer: Self-pay | Admitting: Cardiology

## 2021-01-27 ENCOUNTER — Other Ambulatory Visit: Payer: Self-pay | Admitting: Cardiology

## 2021-01-27 DIAGNOSIS — I480 Paroxysmal atrial fibrillation: Secondary | ICD-10-CM

## 2021-02-06 ENCOUNTER — Encounter: Payer: Self-pay | Admitting: Gastroenterology

## 2021-02-06 ENCOUNTER — Telehealth: Payer: Self-pay | Admitting: Cardiology

## 2021-02-06 MED ORDER — FLECAINIDE ACETATE 50 MG PO TABS: ORAL_TABLET | ORAL | 0 refills | Status: DC

## 2021-02-06 NOTE — Telephone Encounter (Signed)
Medication filled.  

## 2021-02-06 NOTE — Telephone Encounter (Signed)
*  STAT* If patient is at the pharmacy, call can be transferred to refill team.   1. Which medications need to be refilled? (please list name of each medication and dose if known) flecainide (TAMBOCOR) 50 MG tablet  2. Which pharmacy/location (including street and city if local pharmacy) is medication to be sent to? [facility] - [place], [place] - [address]  3. Do they need a 30 day or 90 day supply? 90

## 2021-02-10 ENCOUNTER — Other Ambulatory Visit: Payer: Self-pay

## 2021-02-10 MED ORDER — FLECAINIDE ACETATE 50 MG PO TABS: ORAL_TABLET | ORAL | 0 refills | Status: DC

## 2021-03-25 ENCOUNTER — Ambulatory Visit (AMBULATORY_SURGERY_CENTER): Payer: 59 | Admitting: *Deleted

## 2021-03-25 ENCOUNTER — Other Ambulatory Visit: Payer: Self-pay

## 2021-03-25 VITALS — Ht 74.0 in | Wt 180.0 lb

## 2021-03-25 DIAGNOSIS — Z86010 Personal history of colonic polyps: Secondary | ICD-10-CM

## 2021-03-25 MED ORDER — NA SULFATE-K SULFATE-MG SULF 17.5-3.13-1.6 GM/177ML PO SOLN: 1.0000 | Freq: Once | ORAL | 0 refills | Status: AC

## 2021-03-25 NOTE — Progress Notes (Signed)
PV completed over the phone. Pt verified name, DOB, address and insurance during PV today.  Pt mailed instruction packet with copy of consent form to read and not return, and instructions.   Pt encouraged to call with questions or issues.  If pt has My chart, procedure instructions sent via My Chart   No egg or soy allergy known to patient  No issues known to pt with past sedation with any surgeries or procedures Patient denies ever being told they had issues or difficulty with intubation  No FH of Malignant Hyperthermia Pt is not on diet pills Pt is not on  home 02  Pt is not on blood thinners  Pt denies issues with constipation  Past hx A fib or A flutter  Pt is fully vaccinated  for Covid   NO PA's for preps discussed with pt In PV today  Discussed with pt there will be an out-of-pocket cost for prep and that varies from $0 to 70 +  dollars - pt verbalized understanding   Due to the COVID-19 pandemic we are asking patients to follow certain guidelines in PV and the [facility]   Pt aware of COVID protocols and [facility] guidelines

## 2021-03-28 ENCOUNTER — Ambulatory Visit: Payer: No Typology Code available for payment source | Admitting: Cardiology

## 2021-03-28 ENCOUNTER — Encounter: Payer: Self-pay | Admitting: Cardiology

## 2021-03-28 ENCOUNTER — Other Ambulatory Visit: Payer: Self-pay

## 2021-03-28 VITALS — BP 122/78 | HR 66 | Ht 74.0 in | Wt 176.0 lb

## 2021-03-28 DIAGNOSIS — E785 Hyperlipidemia, unspecified: Secondary | ICD-10-CM

## 2021-03-28 DIAGNOSIS — R931 Abnormal findings on diagnostic imaging of heart and coronary circulation: Secondary | ICD-10-CM | POA: Diagnosis not present

## 2021-03-28 DIAGNOSIS — I480 Paroxysmal atrial fibrillation: Secondary | ICD-10-CM | POA: Diagnosis not present

## 2021-03-28 DIAGNOSIS — Z79899 Other long term (current) drug therapy: Secondary | ICD-10-CM

## 2021-03-28 MED ORDER — METOPROLOL SUCCINATE ER 25 MG PO TB24: 12.5000 mg | ORAL_TABLET | Freq: Every day | ORAL | 4 refills | Status: DC

## 2021-03-28 MED ORDER — FLECAINIDE ACETATE 50 MG PO TABS: ORAL_TABLET | ORAL | 4 refills | Status: DC

## 2021-03-28 MED ORDER — ROSUVASTATIN CALCIUM 10 MG PO TABS: 10.0000 mg | ORAL_TABLET | Freq: Every day | ORAL | 4 refills | Status: DC

## 2021-03-28 MED ORDER — ASPIRIN EC 81 MG PO TBEC: 81.0000 mg | DELAYED_RELEASE_TABLET | Freq: Every day | ORAL | 4 refills | Status: DC

## 2021-03-28 NOTE — Patient Instructions (Signed)
Medication Instructions:  Your physician has recommended you make the following change in your medication:  START: Aspirin 1 mg take one tablet by mouth daily.  *If you need a refill on your cardiac medications before your next appointment, please call your pharmacy*   Lab Work: None If you have labs (blood work) drawn today and your tests are completely normal, you will receive your results only by: MyChart Message (if you have MyChart) OR A paper copy in the mail If you have any lab test that is abnormal or we need to change your treatment, we will call you to review the results.   Testing/Procedures: None   Follow-Up: At [facility], you and your health needs are our priority.  As part of our continuing mission to provide you with exceptional heart care, we have created designated Provider Care Teams.  These Care Teams include your primary Cardiologist (physician) and Advanced Practice Providers (APPs -  Physician Assistants and Nurse Practitioners) who all work together to provide you with the care you need, when you need it.  We recommend signing up for the patient portal called "MyChart".  Sign up information is provided on this After Visit Summary.  MyChart is used to connect with patients for Virtual Visits (Telemedicine).  Patients are able to view lab/test results, encounter notes, upcoming appointments, etc.  Non-urgent messages can be sent to your provider as well.   To learn more about what you can do with MyChart, go to [email or web address].    Your next appointment:   1 year(s)  The format for your next appointment:   In Person  Provider:   Norman Herrlich, MD    Other Instructions

## 2021-03-28 NOTE — Progress Notes (Signed)
Cardiology Office Note:    Date:  03/28/2021   ID:  Joshua Chavez, DOB Feb 16, 1966, MRN [id number]  PCP:  Paulina Fusi, MD  Cardiologist:  Norman Herrlich, MD    Referring MD: Paulina Fusi, MD    ASSESSMENT:    1. AF (paroxysmal atrial fibrillation) (HCC)   2. High risk medication use   3. Agatston coronary artery calcium score between 100 and 199   4. Hyperlipidemia, unspecified hyperlipidemia type    PLAN:    In order of problems listed above:  [name] continues to do well with atrial fibrillation no clinical recurrence he tolerates low-dose flecainide without side effect or toxicity we will continue the same and with his elevated calcium score asked him to take low-dose aspirin.  At this time I do not think he needs to consider anticoagulation. Stable continue flecainide low-dose Continue with statin we will start low-dose aspirin 81 mg daily Continue statin   Next appointment: 1 year   Medication Adjustments/Labs and Tests Ordered: Current medicines are reviewed at length with the patient today.  Concerns regarding medicines are outlined above.  No orders of the defined types were placed in this encounter.  No orders of the defined types were placed in this encounter.   Chief Complaint  Patient presents with   Follow-up   Atrial Fibrillation   History of Present Illness:    Joshua Chavez is a 55 y.o. male with a hx of paroxysmal atrial fibrillation maintaining sinus rhythm on low-dose flecainide hyperlipidemia with elevated coronary artery calcium score last seen 02/22/2020.He was cardioverted to sinus rhythm 10/28/2019 an echocardiogram at [facility] 11/18/2016 showed normal left ventricular size and function.  Cardiac calcium score 03/22/2019 was 166, 89th percentile for age and sex matched control.  He also had a small right middle lobe pulmonary nodule.  Compliance with diet, lifestyle and medications: Yes  He tolerates both his statin without  muscle pain weakness and flecainide without side effect.  No clinical recurrence of atrial fibrillation no palpitations syncope chest pain shortness of breath or edema. Recent labs 12/20/2020 cholesterol 199 LDL 117 triglycerides 148 HDL 56 A1c 5.5% hemoglobin 15.3 creatinine 0.75 Past Medical History:  Diagnosis Date   AF (paroxysmal atrial fibrillation) (HCC) 11/11/2016   CHADS2vasc=0   Anxiety    pt denies   Arrhythmia    Arthritis    Atrial fibrillation (HCC)    GERD (gastroesophageal reflux disease)    Herpes simplex    Hyperlipidemia    IBS (irritable bowel syndrome)     Past Surgical History:  Procedure Laterality Date   CARDIOVERSION  10/2019   COLONOSCOPY  04/08/2016   Colonic polyp status post polypectomy. Small internal hemorrhoids.    ENDOSCOPIC PLANTAR FASCIOTOMY Left    ESOPHAGOGASTRODUODENOSCOPY  04/08/2016   Erosive esophagitis. Small hiatal hernia.    HERNIA REPAIR     HERNIA REPAIR Bilateral    POLYPECTOMY     shouler Left    shoulder   UPPER GASTROINTESTINAL ENDOSCOPY     WISDOM TOOTH EXTRACTION Bilateral     Current Medications: Current Meds  Medication Sig   ANDROGEL PUMP 20.25 MG/ACT (1.62%) GEL    ascorbic acid (VITAMIN C) 1000 MG tablet Take by mouth.   b complex vitamins capsule Take 1 capsule by mouth every other day.    flecainide (TAMBOCOR) 50 MG tablet TAKE ONE (1) TABLET BY MOUTH TWO (2) TIMES DAILY   ketoconazole (NIZORAL) 2 % shampoo Apply topically.   Magnesium 250  MG TABS Take 250 mg by mouth daily.   Melatonin 1 MG TABS Take 1 mg by mouth at bedtime as needed.   metoprolol succinate (TOPROL-XL) 25 MG 24 hr tablet TAKE 1/2 TABLET DAILY   omeprazole (PRILOSEC) 40 MG capsule Take 40 mg by mouth daily.   rosuvastatin (CRESTOR) 10 MG tablet TAKE 1 TABLET DAILY   valACYclovir (VALTREX) 1000 MG tablet Take 1,000 mg by mouth daily as needed.    vitamin E 180 MG (400 UNITS) capsule Take 400 Units by mouth every other day.     Allergies:    Cinnamon   Social History   Socioeconomic History   Marital status: Married    Spouse name: Not on file   Number of children: Not on file   Years of education: Not on file   Highest education level: Not on file  Occupational History   Not on file  Tobacco Use   Smoking status: Former    Types: Cigarettes    Quit date: 2005    Years since quitting: 17.8   Smokeless tobacco: Never  Vaping Use   Vaping Use: Never used  Substance and Sexual Activity   Alcohol use: Not Currently   Drug use: No   Sexual activity: Not on file  Other Topics Concern   Not on file  Social History Narrative   Not on file   Social Determinants of Health   Financial Resource Strain: Not on file  Food Insecurity: Not on file  Transportation Needs: Not on file  Physical Activity: Not on file  Stress: Not on file  Social Connections: Not on file     Family History: The patient's family history includes Colon cancer in his maternal grandfather; Hypertension in his mother. There is no history of Colon polyps, Esophageal cancer, Stomach cancer, or Rectal cancer. ROS:   Please see the history of present illness.    All other systems reviewed and are negative.  EKGs/Labs/Other Studies Reviewed:    The following studies were reviewed today:  EKG:  EKG ordered today and personally reviewed.  The ekg ordered today demonstrates sinus rhythm normal EKG no signs of group 1C antiarrhythmic drug toxicity   Physical Exam:    VS:  BP 122/78   Pulse 66   Ht 6\' 2"  (1.88 m)   Wt 176 lb (79.8 kg)   SpO2 98%   BMI 22.60 kg/m     Wt Readings from Last 3 Encounters:  03/28/21 176 lb (79.8 kg)  03/25/21 180 lb (81.6 kg)  02/22/20 182 lb 9.6 oz (82.8 kg)     GEN:  Well nourished, well developed in no acute distress HEENT: Normal NECK: No JVD; No carotid bruits LYMPHATICS: No lymphadenopathy CARDIAC: RRR, no murmurs, rubs, gallops RESPIRATORY:  Clear to auscultation without rales, wheezing or rhonchi   ABDOMEN: Soft, non-tender, non-distended MUSCULOSKELETAL:  No edema; No deformity  SKIN: Warm and dry NEUROLOGIC:  Alert and oriented x 3 PSYCHIATRIC:  Normal affect    Signed, 02/24/20, MD  03/28/2021 11:24 AM     [facility]

## 2021-03-28 NOTE — Addendum Note (Signed)
Addended by: Delorse Limber I on: 03/28/2021 03:32 PM   Modules accepted: Orders

## 2021-04-02 ENCOUNTER — Telehealth: Payer: Self-pay

## 2021-04-02 NOTE — Telephone Encounter (Signed)
I have called and left a voicemail with patient to return my call and set up an appointment with pre-visit for colonoscopy.

## 2021-04-02 NOTE — Telephone Encounter (Signed)
Patient was already scheduled for the recall colonoscopy on 04/08/21.

## 2021-04-02 NOTE — Telephone Encounter (Signed)
Joshua Chavez was to tell the patient that he needs to KEEP his appointment for 11-29 for a colonoscopy and disregard our call for today. Sorry we didn't see the appointment already scheduled.

## 2021-04-08 ENCOUNTER — Ambulatory Visit (AMBULATORY_SURGERY_CENTER): Payer: No Typology Code available for payment source | Admitting: Gastroenterology

## 2021-04-08 ENCOUNTER — Encounter: Payer: Self-pay | Admitting: Gastroenterology

## 2021-04-08 VITALS — BP 100/68 | HR 56 | Temp 97.3°F | Resp 10 | Ht 74.0 in | Wt 180.0 lb

## 2021-04-08 DIAGNOSIS — Z86010 Personal history of colonic polyps: Secondary | ICD-10-CM | POA: Diagnosis present

## 2021-04-08 MED ORDER — SODIUM CHLORIDE 0.9 % IV SOLN
500.0000 mL | Freq: Once | INTRAVENOUS | Status: DC
Start: 2021-04-08 — End: 2021-04-08

## 2021-04-08 NOTE — Progress Notes (Signed)
Pt's states no medical or surgical changes since previsit or office visit. 

## 2021-04-08 NOTE — Progress Notes (Signed)
Chief Complaint:   Referring Provider:  Paulina Fusi, MD      ASSESSMENT AND PLAN;   #1.  H/O colonic polyps 03/2016.  Next colonoscopy due 03/2021  Plan: -colon   HPI:    Joshua Chavez is a 55 y.o. male  With significant heartburn, odynophagia without dysphagia Very much concerned about esophageal cancers. Omeprazole has been increased from 20 to 40mg  by Dr. over 6 months ago. Has been taking it every day and has still been using several antacids. Cutting down coffee did help.  No fever chills night sweats.  No recent weight loss.  He denies having any significant diarrhea or constipation.  Does have occasional throat pain.  For A Fib on ASA. No AC  Past Medical History:  Diagnosis Date   AF (paroxysmal atrial fibrillation) (HCC) 11/11/2016   CHADS2vasc=0   Anxiety    pt denies   Arrhythmia    Arthritis    Atrial fibrillation (HCC)    GERD (gastroesophageal reflux disease)    Herpes simplex    Hyperlipidemia    IBS (irritable bowel syndrome)     Past Surgical History:  Procedure Laterality Date   CARDIOVERSION  10/2019   COLONOSCOPY  04/08/2016   Colonic polyp status post polypectomy. Small internal hemorrhoids.    ENDOSCOPIC PLANTAR FASCIOTOMY Left    ESOPHAGOGASTRODUODENOSCOPY  04/08/2016   Erosive esophagitis. Small hiatal hernia.    HERNIA REPAIR     HERNIA REPAIR Bilateral    POLYPECTOMY     shouler Left    shoulder   UPPER GASTROINTESTINAL ENDOSCOPY     WISDOM TOOTH EXTRACTION Bilateral     Family History  Problem Relation Age of Onset   Hypertension Mother    Colon cancer Maternal Grandfather    Colon polyps Neg Hx    Esophageal cancer Neg Hx    Stomach cancer Neg Hx    Rectal cancer Neg Hx     Social History   Tobacco Use   Smoking status: Former    Types: Cigarettes    Quit date: 2005    Years since quitting: 17.9   Smokeless tobacco: Never  Vaping Use   Vaping Use: Never used  Substance Use Topics   Alcohol  use: Not Currently   Drug use: No    Current Outpatient Medications  Medication Sig Dispense Refill   ANDROGEL PUMP 20.25 MG/ACT (1.62%) GEL      ascorbic acid (VITAMIN C) 1000 MG tablet Take by mouth.     aspirin EC 81 MG tablet Take 1 tablet (81 mg total) by mouth daily. Swallow whole. 90 tablet 4   b complex vitamins capsule Take 1 capsule by mouth every other day.      flecainide (TAMBOCOR) 50 MG tablet TAKE ONE (1) TABLET BY MOUTH TWO (2) TIMES DAILY 180 tablet 4   ketoconazole (NIZORAL) 2 % shampoo Apply topically.     Magnesium 250 MG TABS Take 250 mg by mouth daily.     Melatonin 1 MG TABS Take 1 mg by mouth at bedtime as needed.     metoprolol succinate (TOPROL-XL) 25 MG 24 hr tablet Take 0.5 tablets (12.5 mg total) by mouth daily. 45 tablet 4   omeprazole (PRILOSEC) 40 MG capsule Take 40 mg by mouth daily.     rosuvastatin (CRESTOR) 10 MG tablet Take 1 tablet (10 mg total) by mouth daily. 90 tablet 4   valACYclovir (VALTREX) 1000 MG tablet Take 1,000 mg by mouth  daily as needed.      vitamin E 180 MG (400 UNITS) capsule Take 400 Units by mouth every other day.     Current Facility-Administered Medications  Medication Dose Route Frequency Provider Last Rate Last Admin   0.9 %  sodium chloride infusion  500 mL Intravenous Once Jackquline Denmark, MD        Allergies  Allergen Reactions   Cinnamon Other (See Comments)    Mouth feels burned / cheeks sore    Review of Systems:  neg Has situational anxiety or depression     Physical Exam:    BP 113/66   Pulse 65   Temp (!) 97.3 F (36.3 C) (Temporal)   Ht 6\' 2"  (1.88 m)   Wt 180 lb (81.6 kg)   SpO2 100%   BMI 23.11 kg/m  Filed Weights   04/08/21 1323  Weight: 180 lb (81.6 kg)   televisit  Data Reviewed: I have personally reviewed following labs and imaging studies  CBC: CBC Latest Ref Rng & Units 10/28/2019 11/07/2016  WBC 4.0 - 10.5 K/uL 6.0 7.1  Hemoglobin 13.0 - 17.0 g/dL 15.5 16.1  Hematocrit 39.0 - 52.0 %  44.1 44.3  Platelets 150 - 400 K/uL 187 179    CMP: CMP Latest Ref Rng & Units 10/28/2019 11/07/2016  Glucose 70 - 99 mg/dL 107(H) 113(H)  BUN 6 - 20 mg/dL 14 17  Creatinine 0.61 - 1.24 mg/dL 0.79 0.85  Sodium 135 - 145 mmol/L 136 139  Potassium 3.5 - 5.1 mmol/L 4.0 3.7  Chloride 98 - 111 mmol/L 103 105  CO2 22 - 32 mmol/L 23 22  Calcium 8.9 - 10.3 mg/dL 8.9 9.6  Total Protein 6.5 - 8.1 g/dL 7.1 7.7  Total Bilirubin 0.3 - 1.2 mg/dL 1.0 1.2  Alkaline Phos 38 - 126 U/L 44 42  AST 15 - 41 U/L 30 25  ALT 0 - 44 U/L 39 28       Carmell Austria, MD 04/08/2021, 1:32 PM  Cc: Nicoletta Dress, MD

## 2021-04-08 NOTE — Op Note (Signed)
[facility] Patient Name: Joshua Chavez Procedure Date: 04/08/2021 1:34 PM MRN: [id number] Endoscopist: Lynann Bologna , MD Age: 55 Referring MD:  Date of Birth: 01/01/1966 Gender: Male Account #: [id number] Procedure:                Colonoscopy Indications:              High risk colon cancer surveillance: Personal                            history of colonic polyps. FH of colon cancer in a                            second-degree relative (GM) Medicines:                Monitored Anesthesia Care Procedure:                Pre-Anesthesia Assessment:                           - Prior to the procedure, a History and Physical                            was performed, and patient medications and                            allergies were reviewed. The patient's tolerance of                            previous anesthesia was also reviewed. The risks                            and benefits of the procedure and the sedation                            options and risks were discussed with the patient.                            All questions were answered, and informed consent                            was obtained. Prior Anticoagulants: The patient has                            taken no previous anticoagulant or antiplatelet                            agents. ASA Grade Assessment: II - A patient with                            mild systemic disease. After reviewing the risks                            and benefits, the patient was deemed in  satisfactory condition to undergo the procedure.                           After obtaining informed consent, the colonoscope                            was passed under direct vision. Throughout the                            procedure, the patient's blood pressure, pulse, and                            oxygen saturations were monitored continuously. The                            Colonoscope was introduced through the  anus and                            advanced to the 2 cm into the ileum. The                            colonoscopy was performed without difficulty. The                            patient tolerated the procedure well. The quality                            of the bowel preparation was good. The terminal                            ileum, ileocecal valve, appendiceal orifice, and                            rectum were photographed. Scope In: 1:41:23 PM Scope Out: 1:48:02 PM Scope Withdrawal Time: 0 hours 4 minutes 33 seconds  Total Procedure Duration: 0 hours 6 minutes 39 seconds  Findings:                 The colon (entire examined portion) appeared normal.                           Non-bleeding internal hemorrhoids were found during                            retroflexion. The hemorrhoids were small and Grade                            I (internal hemorrhoids that do not prolapse).                           The terminal ileum appeared normal.                           The exam was otherwise without abnormality on  direct and retroflexion views. Complications:            No immediate complications. Estimated Blood Loss:     Estimated blood loss: none. Impression:               - The entire examined colon is normal.                           - Non-bleeding internal hemorrhoids (minimal).                           - The examined portion of the ileum was normal.                           - The examination was otherwise normal on direct                            and retroflexion views.                           - No specimens collected. Recommendation:           - Patient has a contact number available for                            emergencies. The signs and symptoms of potential                            delayed complications were discussed with the                            patient. Return to normal activities tomorrow.                            Written  discharge instructions were provided to the                            patient.                           - Resume previous diet.                           - Continue present medications.                           - Repeat colonoscopy in 10 years for screening                            purposes. Earlier, if with any new problems or                            change in family history.                           - The findings and recommendations were discussed  with the patient's family. Lynann Bologna, MD 04/08/2021 1:51:41 PM This report has been signed electronically.

## 2021-04-08 NOTE — Patient Instructions (Addendum)
YOU HAD AN ENDOSCOPIC PROCEDURE TODAY AT THE [facility]:   Refer to the procedure report that was given to you for any specific questions about what was found during the examination.  If the procedure report does not answer your questions, please call your gastroenterologist to clarify.  If you requested that your care partner not be given the details of your procedure findings, then the procedure report has been included in a sealed envelope for you to review at your convenience later. ? ?YOU SHOULD EXPECT: Some feelings of bloating in the abdomen. Passage of more gas than usual.  Walking can help get rid of the air that was put into your GI tract during the procedure and reduce the bloating. If you had a lower endoscopy (such as a colonoscopy or flexible sigmoidoscopy) you may notice spotting of blood in your stool or on the toilet paper. If you underwent a bowel prep for your procedure, you may not have a normal bowel movement for a few days. ? ?Please Note:  You might notice some irritation and congestion in your nose or some drainage.  This is from the oxygen used during your procedure.  There is no need for concern and it should clear up in a day or so. ? ?SYMPTOMS TO REPORT IMMEDIATELY: ? ?Following lower endoscopy (colonoscopy or flexible sigmoidoscopy): ? Excessive amounts of blood in the stool ? Significant tenderness or worsening of abdominal pains ? Swelling of the abdomen that is new, acute ? Fever of 100?F or higher ? ?For urgent or emergent issues, a gastroenterologist can be reached at any hour by calling [phone]. ?Do not use MyChart messaging for urgent concerns.  ? ? ?DIET:  We do recommend a small meal at first, but then you may proceed to your regular diet.  Drink plenty of fluids but you should avoid alcoholic beverages for 24 hours. ? ?ACTIVITY:  You should plan to take it easy for the rest of today and you should NOT DRIVE or use heavy machinery until tomorrow (because of  the sedation medicines used during the test).   ? ?FOLLOW UP: ?Our staff will call the number listed on your records 48-72 hours following your procedure to check on you and address any questions or concerns that you may have regarding the information given to you following your procedure. If we do not reach you, we will leave a message.  We will attempt to reach you two times.  During this call, we will ask if you have developed any symptoms of COVID 19. If you develop any symptoms (ie: fever, flu-like symptoms, shortness of breath, cough etc.) before then, please call [phone].  If you test positive for Covid 19 in the 2 weeks post procedure, please call and report this information to us.   ? ?There were no polyps seen today!  You will need another screening colonoscopy in 10 years, you will receive a letter at that time when you are due for the procedure.   Please call us at [phone] if you have a change in bowel habits, change in family history of colo-rectal cancer, rectal bleeding or other GI concern before that time.  ? ? ?SIGNATURES/CONFIDENTIALITY: ?You and/or your care partner have signed paperwork which will be entered into your electronic medical record.  These signatures attest to the fact that that the information above on your After Visit Summary has been reviewed and is understood.  Full responsibility of the confidentiality of this discharge information   lies with you and/or your care-partner.  ?

## 2021-04-08 NOTE — Progress Notes (Signed)
Report to PACU, RN, vss, BBS= Clear.  

## 2021-04-10 ENCOUNTER — Telehealth: Payer: Self-pay | Admitting: *Deleted

## 2021-04-10 NOTE — Telephone Encounter (Signed)
First follow up call attempt.  LVM. 

## 2021-04-10 NOTE — Telephone Encounter (Signed)
  Follow up Call-  Call back number 04/08/2021  Post procedure Call Back phone  # [phone]  Permission to leave phone message Yes  Some recent data might be hidden     Patient questions:   Message left to call us if necessary.

## 2021-06-19 ENCOUNTER — Telehealth: Payer: Self-pay | Admitting: Cardiology

## 2021-06-19 DIAGNOSIS — I480 Paroxysmal atrial fibrillation: Secondary | ICD-10-CM

## 2021-06-19 MED ORDER — METOPROLOL SUCCINATE ER 25 MG PO TB24: 12.5000 mg | ORAL_TABLET | Freq: Every day | ORAL | 0 refills | Status: DC

## 2021-06-19 NOTE — Telephone Encounter (Signed)
°*  STAT* If patient is at the pharmacy, call can be transferred to refill team.   1. Which medications need to be refilled? (please list name of each medication and dose if known) metoprolol succinate (TOPROL-XL) 25 MG 24 hr tablet  2. Which pharmacy/location (including street and city if local pharmacy) is medication to be sent to? [facility] [place] [facility] PHONE # [phone]  3. Do they need a 30 day or 90 day supply? 90 day  Patient is in [place] and forgot to take his medication with him. Please call script to pharmacy listed above.

## 2021-12-11 ENCOUNTER — Other Ambulatory Visit: Payer: Self-pay | Admitting: Cardiology

## 2021-12-11 DIAGNOSIS — I480 Paroxysmal atrial fibrillation: Secondary | ICD-10-CM

## 2022-02-13 ENCOUNTER — Telehealth: Payer: Self-pay | Admitting: Cardiology

## 2022-02-13 DIAGNOSIS — I480 Paroxysmal atrial fibrillation: Secondary | ICD-10-CM

## 2022-02-13 MED ORDER — METOPROLOL SUCCINATE ER 25 MG PO TB24: 12.5000 mg | ORAL_TABLET | Freq: Every day | ORAL | 0 refills | Status: DC

## 2022-02-13 NOTE — Telephone Encounter (Signed)
Pt c/o medication issue:  1. Name of Medication: metoprolol succinate (TOPROL-XL) 25 MG 24 hr tablet  2. How are you currently taking this medication (dosage and times per day)?   3. Are you having a reaction (difficulty breathing--STAT)?   4. What is your medication issue? This was sent to a [facility] pharmacy and needs to be sent to:    [facility] [place] [place] [facility] [employer]

## 2022-02-13 NOTE — Telephone Encounter (Signed)
Sent Medication in to pharmacy of patient's choice.

## 2022-04-02 ENCOUNTER — Other Ambulatory Visit: Payer: Self-pay | Admitting: Cardiology

## 2022-04-06 NOTE — Telephone Encounter (Signed)
Rx refill sent to pharmacy. 

## 2022-04-19 ENCOUNTER — Other Ambulatory Visit: Payer: Self-pay | Admitting: Cardiology

## 2022-04-20 NOTE — Telephone Encounter (Signed)
Rx refill sent to pharmacy. 

## 2022-04-24 ENCOUNTER — Other Ambulatory Visit: Payer: Self-pay | Admitting: Cardiology

## 2022-04-24 DIAGNOSIS — I480 Paroxysmal atrial fibrillation: Secondary | ICD-10-CM

## 2022-05-18 NOTE — Progress Notes (Unsigned)
Cardiology Office Note:    Date:  05/19/2022 is done very well with his atrial fibrillation he will continue minimum dose of beta-blocker low-dose flecainide and aspirin for stroke prophylaxis.  ID:  Joshua Chavez, DOB March 29, 1966, MRN [id number]  PCP:  Paulina Fusi, MD  Cardiologist:  Norman Herrlich, MD    Referring MD: Paulina Fusi, MD    ASSESSMENT:    1. PAF (paroxysmal atrial fibrillation) (HCC)   2. High risk medication use   3. Agatston coronary artery calcium score between 100 and 199   4. Mixed hyperlipidemia    PLAN:    In order of problems listed above:  Joshua Chavez has done well with atrial fibrillation maintaining sinus rhythm continue low-dose flecainide minimum dose of beta-blocker and aspirin for stroke prophylaxis Currently not on lipid-lowering therapy   Next appointment: 18 months   Medication Adjustments/Labs and Tests Ordered: Current medicines are reviewed at length with the patient today.  Concerns regarding medicines are outlined above.  No orders of the defined types were placed in this encounter.  No orders of the defined types were placed in this encounter.   Chief Complaint  Patient presents with   Follow-up   Atrial Fibrillation    On Flecanide    History of Present Illness:    Joshua Chavez is a 57 y.o. male with a hx of paroxysmal atrial fibrillation maintaining sinus rhythm on low-dose flecainide hyperlipidemia with elevated coronary artery calcium score last seen 03/28/2022.He was cardioverted to sinus rhythm 10/28/2019 an echocardiogram at [facility] 11/18/2016 showed normal left ventricular size and function.  Cardiac calcium score 03/22/2019 was 166, 89th percentile for age and sex matched control.  He also had a small right middle lobe pulmonary nodule.   Compliance with diet, lifestyle and medications: Yes  Joshua Chavez has done well is of no episodes of atrial fibrillation tolerates flecainide and EKG shows no toxicity Discussed  a smart watch she does not want to do that. No cardiovascular complaints of edema shortness of breath chest pain palpitation or syncope Past Medical History:  Diagnosis Date   AF (paroxysmal atrial fibrillation) (HCC) 11/11/2016   CHADS2vasc=0   Anxiety    pt denies   Arrhythmia    Arthritis    Atrial fibrillation (HCC)    GERD (gastroesophageal reflux disease)    Herpes simplex    Hyperlipidemia    IBS (irritable bowel syndrome)     Past Surgical History:  Procedure Laterality Date   CARDIOVERSION  10/2019   COLONOSCOPY  04/08/2016   Colonic polyp status post polypectomy. Small internal hemorrhoids.    ENDOSCOPIC PLANTAR FASCIOTOMY Left    ESOPHAGOGASTRODUODENOSCOPY  04/08/2016   Erosive esophagitis. Small hiatal hernia.    HERNIA REPAIR     HERNIA REPAIR Bilateral    POLYPECTOMY     shouler Left    shoulder   UPPER GASTROINTESTINAL ENDOSCOPY     WISDOM TOOTH EXTRACTION Bilateral     Current Medications: Current Meds  Medication Sig   ANDROGEL PUMP 20.25 MG/ACT (1.62%) GEL    ascorbic acid (VITAMIN C) 1000 MG tablet Take by mouth.   aspirin EC (ASPIRIN LOW DOSE) 81 MG tablet Take 1 tablet (81 mg total) by mouth daily.   b complex vitamins capsule Take 1 capsule by mouth every other day.    ketoconazole (NIZORAL) 2 % shampoo Apply topically.   Magnesium 250 MG TABS Take 250 mg by mouth daily.   Melatonin 1 MG TABS Take 1 mg by  mouth at bedtime as needed.   omeprazole (PRILOSEC) 40 MG capsule Take 40 mg by mouth daily.   rosuvastatin (CRESTOR) 10 MG tablet TAKE 1 TABLET DAILY   valACYclovir (VALTREX) 1000 MG tablet Take 1,000 mg by mouth daily as needed.    vitamin E 180 MG (400 UNITS) capsule Take 400 Units by mouth every other day.   [DISCONTINUED] flecainide (TAMBOCOR) 50 MG tablet Take 1 tablet (50 mg total) by mouth 2 (two) times daily.   [DISCONTINUED] metoprolol succinate (TOPROL-XL) 25 MG 24 hr tablet Take 0.5 tablets (12.5 mg total) by mouth daily. Patient  needs appointment for further refills. 1 st attempt     Allergies:   Cinnamon   Social History   Socioeconomic History   Marital status: Married    Spouse name: Not on file   Number of children: Not on file   Years of education: Not on file   Highest education level: Not on file  Occupational History   Not on file  Tobacco Use   Smoking status: Former    Types: Cigarettes    Quit date: 2005    Years since quitting: 19.0   Smokeless tobacco: Never  Vaping Use   Vaping Use: Never used  Substance and Sexual Activity   Alcohol use: Not Currently   Drug use: No   Sexual activity: Not on file  Other Topics Concern   Not on file  Social History Narrative   Not on file   Social Determinants of Health   Financial Resource Strain: Not on file  Food Insecurity: Not on file  Transportation Needs: Not on file  Physical Activity: Not on file  Stress: Not on file  Social Connections: Not on file     Family History: The patient's family history includes Colon cancer in his maternal grandfather; Hypertension in his mother. There is no history of Colon polyps, Esophageal cancer, Stomach cancer, or Rectal cancer. ROS:   Please see the history of present illness.    All other systems reviewed and are negative.  EKGs/Labs/Other Studies Reviewed:    The following studies were reviewed today:  EKG:  EKG ordered today and personally reviewed.  The ekg ordered today demonstrates sinus rhythm and normal  Recent Labs: 12/24/2021 cholesterol 221 LDL 134 A1c 5.5 hemoglobin 15.2 creatinine 0.98 potassium 4.3 Physical Exam:    VS:  BP 114/74 (BP Location: Right Arm, Patient Position: Sitting)   Pulse 71   Ht 6\' 2"  (1.88 m)   Wt 180 lb (81.6 kg)   SpO2 97%   BMI 23.11 kg/m     Wt Readings from Last 3 Encounters:  05/19/22 180 lb (81.6 kg)  04/08/21 180 lb (81.6 kg)  03/28/21 176 lb (79.8 kg)     GEN: Healthy appearing looks his age well nourished, well developed in no acute  distress HEENT: Normal NECK: No JVD; No carotid bruits LYMPHATICS: No lymphadenopathy CARDIAC: RRR, no murmurs, rubs, gallops RESPIRATORY:  Clear to auscultation without rales, wheezing or rhonchi  ABDOMEN: Soft, non-tender, non-distended MUSCULOSKELETAL:  No edema; No deformity  SKIN: Warm and dry NEUROLOGIC:  Alert and oriented x 3 PSYCHIATRIC:  Normal affect    Signed, Shirlee More, MD  05/19/2022 2:43 PM    Mobile

## 2022-05-19 ENCOUNTER — Encounter: Payer: Self-pay | Admitting: Cardiology

## 2022-05-19 ENCOUNTER — Ambulatory Visit: Payer: No Typology Code available for payment source | Attending: Cardiology | Admitting: Cardiology

## 2022-05-19 ENCOUNTER — Other Ambulatory Visit: Payer: Self-pay

## 2022-05-19 VITALS — BP 114/74 | HR 71 | Ht 74.0 in | Wt 180.0 lb

## 2022-05-19 DIAGNOSIS — I480 Paroxysmal atrial fibrillation: Secondary | ICD-10-CM

## 2022-05-19 DIAGNOSIS — E782 Mixed hyperlipidemia: Secondary | ICD-10-CM | POA: Diagnosis not present

## 2022-05-19 DIAGNOSIS — R931 Abnormal findings on diagnostic imaging of heart and coronary circulation: Secondary | ICD-10-CM

## 2022-05-19 DIAGNOSIS — Z79899 Other long term (current) drug therapy: Secondary | ICD-10-CM

## 2022-05-19 MED ORDER — METOPROLOL SUCCINATE ER 25 MG PO TB24: 12.5000 mg | ORAL_TABLET | Freq: Every day | ORAL | 3 refills | Status: DC

## 2022-05-19 MED ORDER — FLECAINIDE ACETATE 50 MG PO TABS: 50.0000 mg | ORAL_TABLET | Freq: Two times a day (BID) | ORAL | 3 refills | Status: DC

## 2022-05-19 NOTE — Patient Instructions (Signed)
Medication Instructions:  Your physician recommends that you continue on your current medications as directed. Please refer to the Current Medication list given to you today.  *If you need a refill on your cardiac medications before your next appointment, please call your pharmacy*   Lab Work: None If you have labs (blood work) drawn today and your tests are completely normal, you will receive your results only by: MyChart Message (if you have MyChart) OR A paper copy in the mail If you have any lab test that is abnormal or we need to change your treatment, we will call you to review the results.   Testing/Procedures: None   Follow-Up: At [facility], you and your health needs are our priority.  As part of our continuing mission to provide you with exceptional heart care, we have created designated Provider Care Teams.  These Care Teams include your primary Cardiologist (physician) and Advanced Practice Providers (APPs -  Physician Assistants and Nurse Practitioners) who all work together to provide you with the care you need, when you need it.  We recommend signing up for the patient portal called "MyChart".  Sign up information is provided on this After Visit Summary.  MyChart is used to connect with patients for Virtual Visits (Telemedicine).  Patients are able to view lab/test results, encounter notes, upcoming appointments, etc.  Non-urgent messages can be sent to your provider as well.   To learn more about what you can do with MyChart, go to [email or web address].    Your next appointment:   1.5 year(s)  The format for your next appointment:   In Person  Provider:   Brian Munley, MD    Other Instructions None  Important Information About Sugar       

## 2022-07-08 ENCOUNTER — Other Ambulatory Visit: Payer: Self-pay

## 2022-07-08 ENCOUNTER — Telehealth: Payer: Self-pay | Admitting: Cardiology

## 2022-07-08 DIAGNOSIS — I480 Paroxysmal atrial fibrillation: Secondary | ICD-10-CM

## 2022-07-08 MED ORDER — METOPROLOL SUCCINATE ER 25 MG PO TB24: 12.5000 mg | ORAL_TABLET | Freq: Every day | ORAL | 3 refills | Status: DC

## 2022-07-08 NOTE — Telephone Encounter (Signed)
 *  STAT* If patient is at the pharmacy, call can be transferred to refill team.   1. Which medications need to be refilled? (please list name of each medication and dose if known) metoprolol succinate (TOPROL-XL) 25 MG 24 hr tablet   2. Which pharmacy/location (including street and city if local pharmacy) is medication to be sent to? [facility] [place], [place] to [facility]   3. Do they need a 30 day or 90 day supply? 90 days  Per pharmacy refills should always be 90 days supply

## 2022-07-08 NOTE — Telephone Encounter (Signed)
Called patient and informed him that his Toprol XL medication had been re-filled. Patient was appreciative for the call and had no further questions at this time.

## 2022-07-26 ENCOUNTER — Other Ambulatory Visit: Payer: Self-pay | Admitting: Cardiology

## 2022-07-27 NOTE — Telephone Encounter (Signed)
Rx refill sent to pharmacy. 

## 2022-08-27 ENCOUNTER — Other Ambulatory Visit: Payer: Self-pay | Admitting: Cardiology

## 2023-01-25 ENCOUNTER — Telehealth: Payer: Self-pay | Admitting: Gastroenterology

## 2023-01-25 NOTE — Telephone Encounter (Signed)
Inbound call from patient requesting a refill for omeprazole medication. Patient requesting a call back. Please advise, thank you

## 2023-01-26 NOTE — Telephone Encounter (Signed)
Inbound call from patient returning phone call. Patient stated he saw that he received medication through his pcp. Patient thought he had prescription filled with [place]. Patient states he has already had his physical with his pcp and stated he would receive the refill through [place]. Patient requesting a call back. Please advise, thank you.

## 2023-01-26 NOTE — Telephone Encounter (Signed)
Patient notified that we can't prescribe the medication unless he is seen for this and voiced understanding

## 2023-01-26 NOTE — Telephone Encounter (Signed)
LVM for patient to call back. Patient was switched to dexilant in 2020 so we need to figure out what he has been taking since we didn't prescribe omeprazole and who been prescribing it

## 2023-01-26 NOTE — Telephone Encounter (Signed)
LVM for patient to call back again. If his PCP has been doing his refills he should see about getting from them since the doctor is out of the office at this time. That would be better especially in not delaying his medication if he is already out. If he still wants it comes from [place] then I would have to ask another doctor about signing on it since we never prescribed it and Dr Chales Abrahams had switched in him 2020 and that was the last thing noted. Turn around time could be 1-3 days.

## 2023-03-17 ENCOUNTER — Encounter: Payer: Self-pay | Admitting: Gastroenterology

## 2023-03-17 ENCOUNTER — Ambulatory Visit: Payer: 59 | Admitting: Gastroenterology

## 2023-03-17 VITALS — BP 104/76 | HR 64 | Ht 73.0 in | Wt 172.4 lb

## 2023-03-17 DIAGNOSIS — K219 Gastro-esophageal reflux disease without esophagitis: Secondary | ICD-10-CM | POA: Diagnosis not present

## 2023-03-17 DIAGNOSIS — Z860100 Personal history of colon polyps, unspecified: Secondary | ICD-10-CM

## 2023-03-17 DIAGNOSIS — K449 Diaphragmatic hernia without obstruction or gangrene: Secondary | ICD-10-CM

## 2023-03-17 MED ORDER — OMEPRAZOLE 40 MG PO CPDR: 40.0000 mg | DELAYED_RELEASE_CAPSULE | Freq: Every day | ORAL | 4 refills | Status: AC

## 2023-03-17 NOTE — Patient Instructions (Addendum)
_______________________________________________________  If your blood pressure at your visit was 140/90 or greater, please contact your primary care physician to follow up on this.  _______________________________________________________  If you are age 57 or older, your body mass index should be between 23-30. Your Body mass index is 22.74 kg/m. If this is out of the aforementioned range listed, please consider follow up with your Primary Care Provider.  If you are age 42 or younger, your body mass index should be between 19-25. Your Body mass index is 22.74 kg/m. If this is out of the aformentioned range listed, please consider follow up with your Primary Care Provider.   ________________________________________________________  The [facility] providers would like to encourage you to use [facility] to communicate with providers for non-urgent requests or questions.  Due to long hold times on the telephone, sending your provider a message by [facility] may be a faster and more efficient way to get a response.  Please allow 48 business hours for a response.  Please remember that this is for non-urgent requests.  _______________________________________________________  We have sent the following medications to your pharmacy for you to pick up at your convenience: Omeprazole  Follow up as needed. Call with any questions or concerns.  Thank you,  Dr. Lynann Bologna

## 2023-03-17 NOTE — Progress Notes (Signed)
Chief Complaint: Medication refill  Referring Provider:  Paulina Fusi, MD      ASSESSMENT AND PLAN;   #1. GERD with small HH  #2. H/O polyps, neg colon 03/2021. Rpt 10 yrs   Plan: -Omeprazole 40mg  po every day #90, 4RF.  Can try to reduce it to every other day. -FU as needed   HPI:    Joshua Chavez is a 57 y.o. male   FU Doing well. Here for medication refill omeprazole Moving to [place], [place] to take care of his mom.  He bought a mobile home.  Denies having any GI symptoms   Past GI workup: EGD 03/2016 -LA gd B esophagitis -Small HH  Colon 03/2021: neg, rpt 10 yrs.  Colon 03/2016: polyps  Past Medical History:  Diagnosis Date   AF (paroxysmal atrial fibrillation) (HCC) 11/11/2016   CHADS2vasc=0   Anxiety    pt denies   Arrhythmia    Arthritis    Atrial fibrillation (HCC)    GERD (gastroesophageal reflux disease)    Herpes simplex    Hyperlipidemia    IBS (irritable bowel syndrome)     Past Surgical History:  Procedure Laterality Date   CARDIOVERSION  10/2019   COLONOSCOPY  04/08/2016   Colonic polyp status post polypectomy. Small internal hemorrhoids.    ENDOSCOPIC PLANTAR FASCIOTOMY Left    ESOPHAGOGASTRODUODENOSCOPY  04/08/2016   Erosive esophagitis. Small hiatal hernia.    HERNIA REPAIR     HERNIA REPAIR Bilateral    POLYPECTOMY     shouler Left    shoulder   UPPER GASTROINTESTINAL ENDOSCOPY     WISDOM TOOTH EXTRACTION Bilateral     Family History  Problem Relation Age of Onset   Hypertension Mother    Colon cancer Maternal Grandfather    Colon polyps Neg Hx    Esophageal cancer Neg Hx    Stomach cancer Neg Hx    Rectal cancer Neg Hx     Social History   Tobacco Use   Smoking status: Former    Current packs/day: 0.00    Types: Cigarettes    Quit date: 2005    Years since quitting: 19.8   Smokeless tobacco: Never  Vaping Use   Vaping status: Never Used  Substance Use Topics   Alcohol use: Not  Currently   Drug use: No    Current Outpatient Medications  Medication Sig Dispense Refill   ANDROGEL PUMP 20.25 MG/ACT (1.62%) GEL      ascorbic acid (VITAMIN C) 1000 MG tablet Take by mouth.     aspirin EC (ASPIRIN LOW DOSE) 81 MG tablet TAKE 1 TABLET DAILY 90 tablet 3   b complex vitamins capsule Take 1 capsule by mouth every other day.      flecainide (TAMBOCOR) 50 MG tablet TAKE 1 TABLET TWICE A DAY 180 tablet 3   ketoconazole (NIZORAL) 2 % shampoo Apply topically.     Melatonin 1 MG TABS Take 1 mg by mouth at bedtime as needed.     metoprolol succinate (TOPROL-XL) 25 MG 24 hr tablet Take 0.5 tablets (12.5 mg total) by mouth daily. 90 tablet 3   omeprazole (PRILOSEC) 40 MG capsule Take 40 mg by mouth daily.     rosuvastatin (CRESTOR) 10 MG tablet TAKE 1 TABLET DAILY (Patient taking differently: Take 20 mg by mouth daily.) 90 tablet 3   valACYclovir (VALTREX) 1000 MG tablet Take 1,000 mg by mouth daily as needed.      vitamin E  180 MG (400 UNITS) capsule Take 400 Units by mouth every other day.     No current facility-administered medications for this visit.    Allergies  Allergen Reactions   Cinnamon Other (See Comments)    Mouth feels burned / cheeks sore    Review of Systems:  neg     Physical Exam:    BP 104/76 (BP Location: Left Arm, Patient Position: Sitting, Cuff Size: Normal)   Pulse 64   Ht 6\' 1"  (1.854 m) Comment: height measured without shoes  Wt 172 lb 6 oz (78.2 kg)   BMI 22.74 kg/m  Wt Readings from Last 3 Encounters:  03/17/23 172 lb 6 oz (78.2 kg)  05/19/22 180 lb (81.6 kg)  04/08/21 180 lb (81.6 kg)   Constitutional:  Well-developed, in no acute distress. Psychiatric: Normal mood and affect. Behavior is normal. HEENT: Pupils normal.  Conjunctivae are normal. No scleral icterus. Cardiovascular: Normal rate, regular rhythm. No edema Pulmonary/chest: Effort normal and breath sounds normal. No wheezing, rales or rhonchi. Abdominal: Soft,  nondistended. Nontender. Bowel sounds active throughout. There are no masses palpable. No hepatomegaly. Rectal: Deferred Neurological: Alert and oriented to person place and time. Skin: Skin is warm and dry. No rashes noted.  Data Reviewed: I have personally reviewed following labs and imaging studies  CBC:    Latest Ref Rng & Units 10/28/2019    2:02 PM 11/07/2016    3:15 PM  CBC  WBC 4.0 - 10.5 K/uL 6.0  7.1   Hemoglobin 13.0 - 17.0 g/dL 82.9  56.2   Hematocrit 39.0 - 52.0 % 44.1  44.3   Platelets 150 - 400 K/uL 187  179     CMP:    Latest Ref Rng & Units 10/28/2019    2:02 PM 11/07/2016    3:15 PM  CMP  Glucose 70 - 99 mg/dL 130  865   BUN 6 - 20 mg/dL 14  17   Creatinine 7.84 - 1.24 mg/dL 6.96  2.95   Sodium 284 - 145 mmol/L 136  139   Potassium 3.5 - 5.1 mmol/L 4.0  3.7   Chloride 98 - 111 mmol/L 103  105   CO2 22 - 32 mmol/L 23  22   Calcium 8.9 - 10.3 mg/dL 8.9  9.6   Total Protein 6.5 - 8.1 g/dL 7.1  7.7   Total Bilirubin 0.3 - 1.2 mg/dL 1.0  1.2   Alkaline Phos 38 - 126 U/L 44  42   AST 15 - 41 U/L 30  25   ALT 0 - 44 U/L 39  28         Edman Circle, MD 03/17/2023, 8:40 AM  Cc: Paulina Fusi, MD

## 2023-10-01 ENCOUNTER — Telehealth: Payer: Self-pay | Admitting: Cardiology

## 2023-10-01 ENCOUNTER — Other Ambulatory Visit: Payer: Self-pay

## 2023-10-01 DIAGNOSIS — I480 Paroxysmal atrial fibrillation: Secondary | ICD-10-CM

## 2023-10-01 MED ORDER — METOPROLOL SUCCINATE ER 25 MG PO TB24: 12.5000 mg | ORAL_TABLET | Freq: Every day | ORAL | 1 refills | Status: DC

## 2023-10-01 MED ORDER — FLECAINIDE ACETATE 50 MG PO TABS: 50.0000 mg | ORAL_TABLET | Freq: Two times a day (BID) | ORAL | 1 refills | Status: DC

## 2023-10-01 NOTE — Telephone Encounter (Signed)
 *  STAT* If patient is at the pharmacy, call can be transferred to refill team.   1. Which medications need to be refilled? (please list name of each medication and dose if known)   flecainide  (TAMBOCOR ) 50 MG tablet    metoprolol  succinate (TOPROL -XL) 25 MG 24 hr tablet    2. Would you like to learn more about the convenience, safety, & potential cost savings by using the [facility]? No      3. Are you open to using the [facility] (Type [facility]. ). No   4. Which pharmacy/location (including street and city if local pharmacy) is medication to be sent to? [facility] pharmacy [address], [place], [place] [address] Phone: [phone]   5. Do they need a 30 day or 90 day supply? 90 days

## 2023-10-01 NOTE — Telephone Encounter (Signed)
 Pt's medications were sent to pt's pharmacy as requested. Confirmation requested.

## 2023-11-05 ENCOUNTER — Encounter: Payer: Self-pay | Admitting: Cardiology

## 2023-11-23 ENCOUNTER — Other Ambulatory Visit: Payer: Self-pay | Admitting: Cardiology

## 2023-11-23 DIAGNOSIS — I480 Paroxysmal atrial fibrillation: Secondary | ICD-10-CM

## 2023-12-09 ENCOUNTER — Ambulatory Visit: Admitting: Cardiology

## 2023-12-22 ENCOUNTER — Other Ambulatory Visit: Payer: Self-pay | Admitting: Cardiology

## 2023-12-22 DIAGNOSIS — I480 Paroxysmal atrial fibrillation: Secondary | ICD-10-CM

## 2024-03-30 ENCOUNTER — Telehealth: Payer: Self-pay | Admitting: Cardiology

## 2024-03-30 DIAGNOSIS — I480 Paroxysmal atrial fibrillation: Secondary | ICD-10-CM

## 2024-03-30 MED ORDER — METOPROLOL SUCCINATE ER 25 MG PO TB24: 12.5000 mg | ORAL_TABLET | Freq: Two times a day (BID) | ORAL | 1 refills | Status: AC

## 2024-03-30 MED ORDER — FLECAINIDE ACETATE 50 MG PO TABS: 50.0000 mg | ORAL_TABLET | Freq: Two times a day (BID) | ORAL | 1 refills | Status: AC

## 2024-03-30 NOTE — Telephone Encounter (Signed)
 Per Dr. Monetta, refills sent.

## 2024-03-30 NOTE — Telephone Encounter (Signed)
*  STAT* If patient is at the pharmacy, call can be transferred to refill team.   1. Which medications need to be refilled? (please list name of each medication and dose if known) metoprolol  succinate (TOPROL -XL) 25 MG 24 hr tablet  flecainide  (TAMBOCOR ) 50 MG tablet    4. Which pharmacy/location (including street and city if local pharmacy) is medication to be sent to?  [id number] - [place], [place] - [address]     5. Do they need a 30 day or 90 day supply? 90

## 2024-05-17 ENCOUNTER — Ambulatory Visit: Payer: Self-pay | Admitting: Cardiology
